# Patient Record
Sex: Female | Born: 1985 | Race: White | Hispanic: No | Marital: Married | Smoking: Never smoker
Health system: Southern US, Community
[De-identification: ages and names within clinical notes are randomized; demographics above are authoritative.]

## PROBLEM LIST (undated history)

## (undated) ENCOUNTER — Inpatient Hospital Stay (HOSPITAL_COMMUNITY): Payer: Self-pay

## (undated) DIAGNOSIS — R112 Nausea with vomiting, unspecified: Secondary | ICD-10-CM

## (undated) DIAGNOSIS — R7303 Prediabetes: Secondary | ICD-10-CM

## (undated) DIAGNOSIS — K219 Gastro-esophageal reflux disease without esophagitis: Secondary | ICD-10-CM

## (undated) DIAGNOSIS — J45909 Unspecified asthma, uncomplicated: Secondary | ICD-10-CM

## (undated) DIAGNOSIS — Z9889 Other specified postprocedural states: Secondary | ICD-10-CM

## (undated) DIAGNOSIS — K589 Irritable bowel syndrome without diarrhea: Secondary | ICD-10-CM

## (undated) DIAGNOSIS — F329 Major depressive disorder, single episode, unspecified: Secondary | ICD-10-CM

## (undated) DIAGNOSIS — O24419 Gestational diabetes mellitus in pregnancy, unspecified control: Secondary | ICD-10-CM

## (undated) DIAGNOSIS — T8859XA Other complications of anesthesia, initial encounter: Secondary | ICD-10-CM

## (undated) DIAGNOSIS — D649 Anemia, unspecified: Secondary | ICD-10-CM

## (undated) DIAGNOSIS — F419 Anxiety disorder, unspecified: Secondary | ICD-10-CM

## (undated) DIAGNOSIS — J189 Pneumonia, unspecified organism: Secondary | ICD-10-CM

## (undated) DIAGNOSIS — B2790 Infectious mononucleosis, unspecified without complication: Secondary | ICD-10-CM

## (undated) DIAGNOSIS — T4145XA Adverse effect of unspecified anesthetic, initial encounter: Secondary | ICD-10-CM

## (undated) DIAGNOSIS — N200 Calculus of kidney: Secondary | ICD-10-CM

## (undated) DIAGNOSIS — F32A Depression, unspecified: Secondary | ICD-10-CM

## (undated) HISTORY — PX: COLONOSCOPY: SHX174

## (undated) HISTORY — PX: KNEE SURGERY: SHX244

## (undated) HISTORY — PX: TONSILLECTOMY: SUR1361

## (undated) HISTORY — PX: UPPER GASTROINTESTINAL ENDOSCOPY: SHX188

## (undated) HISTORY — PX: OTHER SURGICAL HISTORY: SHX169

## (undated) HISTORY — PX: BACK SURGERY: SHX140

---
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
## 2005-06-10 ENCOUNTER — Other Ambulatory Visit: Admission: RE | Admit: 2005-06-10 | Discharge: 2005-06-10 | Payer: Self-pay | Admitting: Obstetrics and Gynecology

## 2012-06-23 DIAGNOSIS — N970 Female infertility associated with anovulation: Secondary | ICD-10-CM | POA: Insufficient documentation

## 2012-08-21 DIAGNOSIS — R102 Pelvic and perineal pain unspecified side: Secondary | ICD-10-CM | POA: Insufficient documentation

## 2014-10-05 ENCOUNTER — Emergency Department
Admission: EM | Admit: 2014-10-05 | Discharge: 2014-10-05 | Disposition: A | Discharge status: Home / Self Care | Payer: Self-pay | Source: Home / Self Care | Attending: Emergency Medicine | Admitting: Emergency Medicine

## 2014-10-05 ENCOUNTER — Encounter: Payer: Self-pay | Admitting: *Deleted

## 2014-10-05 DIAGNOSIS — J039 Acute tonsillitis, unspecified: Secondary | ICD-10-CM

## 2014-10-05 LAB — POCT RAPID STREP A (OFFICE): Rapid Strep A Screen: NEGATIVE

## 2014-10-05 MED ORDER — CEFDINIR 300 MG PO CAPS: 300.0000 mg | ORAL_CAPSULE | Freq: Two times a day (BID) | ORAL | Status: DC

## 2014-10-05 NOTE — ED Notes (Signed)
Pt c/o sore throat and HA x 1 wk. Denies fever.

## 2014-10-05 NOTE — ED Provider Notes (Signed)
CSN: [id number]     Arrival date & time 10/05/14  1946 History   First MD Initiated Contact with Patient 10/05/14 1957     Chief Complaint  Patient presents with  . Sore Throat  . Headache   here with husband  HPI SORE THROAT Onset: 7 days    Severity: moderate-severe Tried OTC meds without significant relief.  Symptoms:  + Fever  + Swollen neck glands No Recent Strep Exposure     No Myalgias Mild Headache No Rash  No Discolored Nasal Mucus No Allergy symptoms No sinus pain/pressure No itchy/red eyes No earache Positive hoarse voice  No Drooling No Trismus  No Nausea No Vomiting No Abdominal pain No Diarrhea No Reflux symptoms  No Cough No Breathing Difficulty No Shortness of Breath No pleuritic pain No Wheezing No Hemoptysis   No past medical history on file. No past surgical history on file. No family history on file. History  Substance Use Topics  . Smoking status: Not on file  . Smokeless tobacco: Not on file  . Alcohol Use: Not on file   OB History    No data available     Review of Systems  All other systems reviewed and are negative.   Allergies  Review of patient's allergies indicates no known allergies.  Home Medications   Prior to Admission medications   Not on File   BP 138/90 mmHg  Pulse 105  Temp(Src) 98.2 F (36.8 C) (Oral)  Resp 18  Ht 5\' 4"  (1.626 m)  Wt 231 lb (104.781 kg)  BMI 39.63 kg/m2  SpO2 98% Physical Exam  Constitutional: She is oriented to person, place, and time. She appears well-developed and well-nourished.  Non-toxic appearance. She appears ill. No distress.  HENT:  Head: Normocephalic and atraumatic.  Right Ear: Tympanic membrane, external ear and ear canal normal.  Left Ear: Tympanic membrane, external ear and ear canal normal.  Nose: Nose normal. Right sinus exhibits no maxillary sinus tenderness and no frontal sinus tenderness. Left sinus exhibits no maxillary sinus tenderness and no frontal  sinus tenderness.  Mouth/Throat: Uvula is midline and mucous membranes are normal. No oral lesions. Oropharyngeal exudate and posterior oropharyngeal erythema present. No tonsillar abscesses.  + 3 Tonsillar enlargement bilaterally  Airway intact.  Eyes: Conjunctivae are normal. No scleral icterus.  Neck: Neck supple.  Cardiovascular: Normal rate, regular rhythm and normal heart sounds.   No murmur heard. Pulmonary/Chest: Effort normal and breath sounds normal. No stridor. No respiratory distress. She has no wheezes. She has no rhonchi. She has no rales.  Abdominal: Soft. She exhibits no mass. There is no hepatosplenomegaly. There is no tenderness.  Lymphadenopathy:    She has cervical adenopathy.       Right cervical: Superficial cervical adenopathy present. No deep cervical and no posterior cervical adenopathy present.      Left cervical: Superficial cervical adenopathy present. No deep cervical and no posterior cervical adenopathy present.  Neurological: She is alert and oriented to person, place, and time.  Skin: Skin is warm. No rash noted.  Psychiatric: She has a normal mood and affect.  Nursing note and vitals reviewed.   ED Course  Procedures (including critical care time) Labs Review Labs Reviewed - No data to display  Imaging Review No results found.   MDM   1. Acute tonsillitis    exudative tonsillitis 7 days Rapid strep test negative Treatment options discussed with patient. After risks, benefits, alternatives discussed, she agrees with the following  plans: New Prescriptions   CEFDINIR (OMNICEF) 300 MG CAPSULE    Take 1 capsule (300 mg total) by mouth 2 (two) times daily. X 10 days   Other symptomatic care discussed. Follow-up with your primary care doctor in 5-7 days if not improving, or sooner if symptoms become worse. Precautions discussed. Red flags discussed. Questions invited and answered. Patient voiced understanding and agreement.     Lajean Manesavid  Massey, MD 10/05/14 2005

## 2015-10-03 ENCOUNTER — Encounter (HOSPITAL_COMMUNITY): Payer: Self-pay

## 2015-10-06 ENCOUNTER — Other Ambulatory Visit: Payer: Self-pay | Admitting: Obstetrics & Gynecology

## 2015-10-20 NOTE — Anesthesia Preprocedure Evaluation (Addendum)
Anesthesia Evaluation  Patient identified by MRN, date of birth, ID band Patient awake    Reviewed: Allergy & Precautions, NPO status , Patient's Chart, lab work & pertinent test results  History of Anesthesia Complications (+) PONV, Emergence Delirium and history of anesthetic complications  Airway Mallampati: II  TM Distance: >3 FB Neck ROM: Full    Dental  (+) Teeth Intact, Dental Advisory Given   Pulmonary asthma , pneumonia, resolved,  Pneumonia following tonsillectomy 8 weeks ago.  Completed antibiotic course.  Feels her breathing is at baseline.   Pulmonary exam normal breath sounds clear to auscultation       Cardiovascular Exercise Tolerance: Good (-) hypertensionnegative cardio ROS Normal cardiovascular exam Rhythm:Regular Rate:Normal     Neuro/Psych PSYCHIATRIC DISORDERS Anxiety Depression negative neurological ROS     GI/Hepatic Neg liver ROS, GERD  ,  Endo/Other  Obesity   Renal/GU negative Renal ROS  negative genitourinary   Musculoskeletal negative musculoskeletal ROS (+)   Abdominal   Peds negative pediatric ROS (+)  Hematology  (+) Blood dyscrasia, anemia ,   Anesthesia Other Findings Day of surgery medications reviewed with the patient.  On day 7/10 of amoxicillin for cold. Denies fever, chills, purulent drainage, negative strep test.  Reproductive/Obstetrics negative OB ROS                           Anesthesia Physical Anesthesia Plan  ASA: II  Anesthesia Plan: General   Post-op Pain Management:    Induction: Intravenous  Airway Management Planned: Oral ETT  Additional Equipment:   Intra-op Plan:   Post-operative Plan: Extubation in OR  Informed Consent: I have reviewed the patients History and Physical, chart, labs and discussed the procedure including the risks, benefits and alternatives for the proposed anesthesia with the patient or authorized  representative who has indicated his/her understanding and acceptance.   Dental advisory given  Plan Discussed with: CRNA  Anesthesia Plan Comments: (Risks/benefits of general anesthesia discussed with patient including risk of damage to teeth, lips, gum, and tongue, nausea/vomiting, allergic reactions to medications, and the possibility of heart attack, stroke and death.  All patient questions answered.  Patient wishes to proceed.)       Anesthesia Quick Evaluation

## 2015-10-25 MED ORDER — DEXTROSE 5 % IV SOLN
100.0000 mg | Freq: Once | INTRAVENOUS | Status: AC
  Administered 2015-10-26: 100 mg via INTRAVENOUS
  Filled 2015-10-25: qty 100

## 2015-10-26 ENCOUNTER — Encounter (HOSPITAL_COMMUNITY)
Admission: RE | Disposition: A | Discharge status: Home / Self Care | Payer: Self-pay | Source: Ambulatory Visit | Attending: Obstetrics & Gynecology

## 2015-10-26 ENCOUNTER — Ambulatory Visit (HOSPITAL_COMMUNITY): Payer: BLUE CROSS/BLUE SHIELD | Admitting: Anesthesiology

## 2015-10-26 ENCOUNTER — Encounter (HOSPITAL_COMMUNITY): Payer: Self-pay | Admitting: *Deleted

## 2015-10-26 ENCOUNTER — Ambulatory Visit (HOSPITAL_COMMUNITY)
Admission: RE | Admit: 2015-10-26 | Discharge: 2015-10-26 | Disposition: A | Discharge status: Home / Self Care | Payer: BLUE CROSS/BLUE SHIELD | Source: Ambulatory Visit | Attending: Obstetrics & Gynecology | Admitting: Obstetrics & Gynecology

## 2015-10-26 DIAGNOSIS — K219 Gastro-esophageal reflux disease without esophagitis: Secondary | ICD-10-CM | POA: Insufficient documentation

## 2015-10-26 DIAGNOSIS — R1032 Left lower quadrant pain: Secondary | ICD-10-CM | POA: Insufficient documentation

## 2015-10-26 DIAGNOSIS — R102 Pelvic and perineal pain: Secondary | ICD-10-CM | POA: Diagnosis present

## 2015-10-26 DIAGNOSIS — J45909 Unspecified asthma, uncomplicated: Secondary | ICD-10-CM | POA: Insufficient documentation

## 2015-10-26 DIAGNOSIS — N946 Dysmenorrhea, unspecified: Secondary | ICD-10-CM | POA: Diagnosis not present

## 2015-10-26 HISTORY — DX: Depression, unspecified: F32.A

## 2015-10-26 HISTORY — DX: Anemia, unspecified: D64.9

## 2015-10-26 HISTORY — DX: Unspecified asthma, uncomplicated: J45.909

## 2015-10-26 HISTORY — PX: LAPAROSCOPY: SHX197

## 2015-10-26 HISTORY — DX: Nausea with vomiting, unspecified: R11.2

## 2015-10-26 HISTORY — DX: Pneumonia, unspecified organism: J18.9

## 2015-10-26 HISTORY — DX: Calculus of kidney: N20.0

## 2015-10-26 HISTORY — DX: Adverse effect of unspecified anesthetic, initial encounter: T41.45XA

## 2015-10-26 HISTORY — DX: Other complications of anesthesia, initial encounter: T88.59XA

## 2015-10-26 HISTORY — DX: Gastro-esophageal reflux disease without esophagitis: K21.9

## 2015-10-26 HISTORY — DX: Other specified postprocedural states: Z98.890

## 2015-10-26 HISTORY — DX: Major depressive disorder, single episode, unspecified: F32.9

## 2015-10-26 HISTORY — PX: CHROMOPERTUBATION: SHX6288

## 2015-10-26 HISTORY — DX: Irritable bowel syndrome, unspecified: K58.9

## 2015-10-26 HISTORY — DX: Anxiety disorder, unspecified: F41.9

## 2015-10-26 LAB — CBC
HEMATOCRIT: 38 % (ref 36.0–46.0)
HEMOGLOBIN: 12.5 g/dL (ref 12.0–15.0)
MCH: 28.2 pg (ref 26.0–34.0)
MCHC: 32.9 g/dL (ref 30.0–36.0)
MCV: 85.8 fL (ref 78.0–100.0)
Platelets: 363 10*3/uL (ref 150–400)
RBC: 4.43 MIL/uL (ref 3.87–5.11)
RDW: 14 % (ref 11.5–15.5)
WBC: 8.3 10*3/uL (ref 4.0–10.5)

## 2015-10-26 LAB — PREGNANCY, URINE: PREG TEST UR: NEGATIVE

## 2015-10-26 SURGERY — LAPAROSCOPY, DIAGNOSTIC
Anesthesia: General | Site: Vagina

## 2015-10-26 MED ORDER — OXYCODONE-ACETAMINOPHEN 5-325 MG PO TABS: 1.0000 | ORAL_TABLET | Freq: Four times a day (QID) | ORAL | Status: DC | PRN

## 2015-10-26 MED ORDER — BUPIVACAINE HCL (PF) 0.25 % IJ SOLN
INTRAMUSCULAR | Status: DC | PRN
  Administered 2015-10-26: 10 mL

## 2015-10-26 MED ORDER — PROPOFOL 10 MG/ML IV BOLUS
INTRAVENOUS | Status: DC | PRN
  Administered 2015-10-26: 200 mg via INTRAVENOUS

## 2015-10-26 MED ORDER — ONDANSETRON 4 MG PO TBDP
ORAL_TABLET | ORAL | Status: AC
  Filled 2015-10-26: qty 1

## 2015-10-26 MED ORDER — PROPOFOL 10 MG/ML IV BOLUS
INTRAVENOUS | Status: AC
  Filled 2015-10-26: qty 20

## 2015-10-26 MED ORDER — LACTATED RINGERS IV SOLN
INTRAVENOUS | Status: DC
  Administered 2015-10-26 (×2): via INTRAVENOUS

## 2015-10-26 MED ORDER — MIDAZOLAM HCL 5 MG/5ML IJ SOLN
INTRAMUSCULAR | Status: DC | PRN
  Administered 2015-10-26: 2 mg via INTRAVENOUS

## 2015-10-26 MED ORDER — ONDANSETRON HCL 4 MG/2ML IJ SOLN
INTRAMUSCULAR | Status: AC
  Filled 2015-10-26: qty 2

## 2015-10-26 MED ORDER — PROMETHAZINE HCL 25 MG/ML IJ SOLN
6.2500 mg | INTRAMUSCULAR | Status: DC | PRN
Start: 2015-10-26 — End: 2015-10-26

## 2015-10-26 MED ORDER — NEOSTIGMINE METHYLSULFATE 10 MG/10ML IV SOLN
INTRAVENOUS | Status: DC | PRN
  Administered 2015-10-26: 5 mg via INTRAVENOUS

## 2015-10-26 MED ORDER — HYDROMORPHONE HCL 1 MG/ML IJ SOLN
INTRAMUSCULAR | Status: DC | PRN
  Administered 2015-10-26: 1 mg via INTRAVENOUS

## 2015-10-26 MED ORDER — OXYCODONE-ACETAMINOPHEN 5-325 MG PO TABS
ORAL_TABLET | ORAL | Status: AC
  Filled 2015-10-26: qty 1

## 2015-10-26 MED ORDER — SCOPOLAMINE 1 MG/3DAYS TD PT72
1.0000 | MEDICATED_PATCH | Freq: Once | TRANSDERMAL | Status: DC
  Administered 2015-10-26: 1.5 mg via TRANSDERMAL

## 2015-10-26 MED ORDER — HYDROMORPHONE HCL 1 MG/ML IJ SOLN
INTRAMUSCULAR | Status: AC
  Filled 2015-10-26: qty 1

## 2015-10-26 MED ORDER — ROCURONIUM BROMIDE 100 MG/10ML IV SOLN
INTRAVENOUS | Status: DC | PRN
  Administered 2015-10-26: 10 mg via INTRAVENOUS
  Administered 2015-10-26: 50 mg via INTRAVENOUS

## 2015-10-26 MED ORDER — KETOROLAC TROMETHAMINE 30 MG/ML IJ SOLN
INTRAMUSCULAR | Status: DC | PRN
  Administered 2015-10-26: 30 mg via INTRAVENOUS

## 2015-10-26 MED ORDER — METHYLENE BLUE 1 % INJ SOLN
INTRAMUSCULAR | Status: AC
  Filled 2015-10-26: qty 1

## 2015-10-26 MED ORDER — ONDANSETRON 4 MG PO TBDP
4.0000 mg | ORAL_TABLET | Freq: Once | ORAL | Status: AC
  Administered 2015-10-26: 4 mg via ORAL

## 2015-10-26 MED ORDER — DEXAMETHASONE SODIUM PHOSPHATE 10 MG/ML IJ SOLN
INTRAMUSCULAR | Status: DC | PRN
  Administered 2015-10-26: 10 mg via INTRAVENOUS

## 2015-10-26 MED ORDER — FENTANYL CITRATE (PF) 100 MCG/2ML IJ SOLN: 25.0000 ug | INTRAMUSCULAR | Status: DC | PRN

## 2015-10-26 MED ORDER — NEOSTIGMINE METHYLSULFATE 10 MG/10ML IV SOLN
INTRAVENOUS | Status: AC
  Filled 2015-10-26: qty 1

## 2015-10-26 MED ORDER — ROCURONIUM BROMIDE 100 MG/10ML IV SOLN
INTRAVENOUS | Status: AC
  Filled 2015-10-26: qty 1

## 2015-10-26 MED ORDER — FENTANYL CITRATE (PF) 100 MCG/2ML IJ SOLN
INTRAMUSCULAR | Status: DC | PRN
Start: 2015-10-26 — End: 2015-10-26
  Administered 2015-10-26: 150 ug via INTRAVENOUS
  Administered 2015-10-26: 100 ug via INTRAVENOUS

## 2015-10-26 MED ORDER — GLYCOPYRROLATE 0.2 MG/ML IJ SOLN
INTRAMUSCULAR | Status: AC
  Filled 2015-10-26: qty 3

## 2015-10-26 MED ORDER — MIDAZOLAM HCL 2 MG/2ML IJ SOLN
INTRAMUSCULAR | Status: AC
  Filled 2015-10-26: qty 2

## 2015-10-26 MED ORDER — FENTANYL CITRATE (PF) 250 MCG/5ML IJ SOLN
INTRAMUSCULAR | Status: AC
  Filled 2015-10-26: qty 5

## 2015-10-26 MED ORDER — LIDOCAINE HCL (CARDIAC) 20 MG/ML IV SOLN
INTRAVENOUS | Status: DC | PRN
  Administered 2015-10-26: 100 mg via INTRAVENOUS

## 2015-10-26 MED ORDER — OXYCODONE-ACETAMINOPHEN 5-325 MG PO TABS
1.0000 | ORAL_TABLET | Freq: Once | ORAL | Status: AC
  Administered 2015-10-26: 1 via ORAL

## 2015-10-26 MED ORDER — LIDOCAINE HCL (CARDIAC) 20 MG/ML IV SOLN
INTRAVENOUS | Status: AC
  Filled 2015-10-26: qty 5

## 2015-10-26 MED ORDER — KETOROLAC TROMETHAMINE 30 MG/ML IJ SOLN
INTRAMUSCULAR | Status: AC
  Filled 2015-10-26: qty 1

## 2015-10-26 MED ORDER — GLYCOPYRROLATE 0.2 MG/ML IJ SOLN
INTRAMUSCULAR | Status: DC | PRN
  Administered 2015-10-26: .6 mg via INTRAVENOUS

## 2015-10-26 MED ORDER — BUPIVACAINE HCL (PF) 0.25 % IJ SOLN
INTRAMUSCULAR | Status: AC
  Filled 2015-10-26: qty 30

## 2015-10-26 MED ORDER — ONDANSETRON HCL 4 MG/2ML IJ SOLN
INTRAMUSCULAR | Status: DC | PRN
  Administered 2015-10-26: 4 mg via INTRAVENOUS

## 2015-10-26 MED ORDER — SCOPOLAMINE 1 MG/3DAYS TD PT72
MEDICATED_PATCH | TRANSDERMAL | Status: AC
  Administered 2015-10-26: 1.5 mg via TRANSDERMAL
  Filled 2015-10-26: qty 1

## 2015-10-26 MED ORDER — DEXAMETHASONE SODIUM PHOSPHATE 10 MG/ML IJ SOLN
INTRAMUSCULAR | Status: AC
  Filled 2015-10-26: qty 1

## 2015-10-26 MED ORDER — SODIUM CHLORIDE 0.9 % IJ SOLN
INTRAMUSCULAR | Status: AC
  Filled 2015-10-26: qty 10

## 2015-10-26 SURGICAL SUPPLY — 35 items
BAG SPEC RTRVL LRG 6X4 10 (ENDOMECHANICALS)
CABLE HIGH FREQUENCY MONO STRZ (ELECTRODE) IMPLANT
CATH ROBINSON RED A/P 16FR (CATHETERS) ×2 IMPLANT
CHLORAPREP W/TINT 26ML (MISCELLANEOUS) ×4 IMPLANT
CLOTH BEACON ORANGE TIMEOUT ST (SAFETY) ×4 IMPLANT
DRSG COVADERM PLUS 2X2 (GAUZE/BANDAGES/DRESSINGS) ×4 IMPLANT
DRSG OPSITE POSTOP 3X4 (GAUZE/BANDAGES/DRESSINGS) ×4 IMPLANT
EVACUATOR SMOKE 8.L (FILTER) ×2 IMPLANT
FORCEPS CUTTING 33CM 5MM (CUTTING FORCEPS) IMPLANT
FORCEPS CUTTING 45CM 5MM (CUTTING FORCEPS) IMPLANT
GLOVE BIO SURGEON STRL SZ7 (GLOVE) ×4 IMPLANT
GLOVE BIOGEL PI IND STRL 7.0 (GLOVE) ×4 IMPLANT
GLOVE BIOGEL PI INDICATOR 7.0 (GLOVE) ×4
GOWN STRL REUS W/TWL LRG LVL3 (GOWN DISPOSABLE) ×14 IMPLANT
IV SET EXTENSION 150ML W/STPCK (IV SETS) ×2 IMPLANT
LIQUID BAND (GAUZE/BANDAGES/DRESSINGS) ×4 IMPLANT
MANIPULATOR UTERINE 4.5 ZUMI (MISCELLANEOUS) ×4 IMPLANT
NEEDLE INSUFFLATION 120MM (ENDOMECHANICALS) ×4 IMPLANT
NS IRRIG 1000ML POUR BTL (IV SOLUTION) ×4 IMPLANT
PACK LAPAROSCOPY BASIN (CUSTOM PROCEDURE TRAY) ×4 IMPLANT
PAD POSITIONING PINK XL (MISCELLANEOUS) ×4 IMPLANT
POUCH SPECIMEN RETRIEVAL 10MM (ENDOMECHANICALS) IMPLANT
SCISSORS LAP 5X35 DISP (ENDOMECHANICALS) IMPLANT
SET IRRIG TUBING LAPAROSCOPIC (IRRIGATION / IRRIGATOR) IMPLANT
SLEEVE XCEL OPT CAN 5 100 (ENDOMECHANICALS) ×1 IMPLANT
SOLUTION ELECTROLUBE (MISCELLANEOUS) IMPLANT
SUT VICRYL 0 UR6 27IN ABS (SUTURE) ×4 IMPLANT
SUT VICRYL 4-0 PS2 18IN ABS (SUTURE) ×4 IMPLANT
SYR TOOMEY 50ML (SYRINGE) ×2 IMPLANT
TOWEL OR 17X24 6PK STRL BLUE (TOWEL DISPOSABLE) ×8 IMPLANT
TROCAR BALLN 12MMX100 BLUNT (TROCAR) ×4 IMPLANT
TROCAR XCEL NON-BLD 11X100MML (ENDOMECHANICALS) ×4 IMPLANT
TROCAR XCEL NON-BLD 5MMX100MML (ENDOMECHANICALS) ×4 IMPLANT
WARMER LAPAROSCOPE (MISCELLANEOUS) ×4 IMPLANT
WATER STERILE IRR 1000ML POUR (IV SOLUTION) ×4 IMPLANT

## 2015-10-26 NOTE — Transfer of Care (Signed)
Immediate Anesthesia Transfer of Care Note  Patient: Amy Rodgers  Procedure(s) Performed: Procedure(s): LAPAROSCOPY DIAGNOSTIC  (N/A) CHROMOPERTUBATION (Bilateral)  Patient Location: PACU  Anesthesia Type:General  Level of Consciousness: awake, alert  and oriented  Airway & Oxygen Therapy: Patient Spontanous Breathing and Patient connected to face mask oxygen  Post-op Assessment: Report given to RN and Post -op Vital signs reviewed and stable  Post vital signs: Reviewed and stable  Last Vitals:  Filed Vitals:   10/26/15 1140  BP: 146/97  Pulse: 92  Temp: 36.8 C  Resp: 18    Complications: No apparent anesthesia complications

## 2015-10-26 NOTE — Op Note (Addendum)
Preoperative diagnosis: Pelvic pain, Dysmenorrhea Postoperative diagnosis: Same Procedure: Diagnostic laparoscopy, chromopertubation Surgeon: Dr Shea EvansVaishali Annelisa Ryback, MD Assistants: Donette LarryMelanie Bhambri, CNM Anesthesia Gen. Endotracheal IV fluids LR 2500 cc  EBL minimal, 10 cc Drains: Intra-op Foley Complications none Disposition PACU and home Specimens none  Procedure Patient is 29 yo, G0, who was seen in office for recurrent pelvic pain, especially left lower quadrant since August'16 that would get for few weeks and get worse with menses. She was put on continuous oral contraceptive pills and pain improved but would return with break though bleeding or with menses. So endometriosis was suspected. She also had prior failed infertility treatments with a specialist. So laparoscopy was planned with possible fulguration of endometriosis if noted.  Risk and complications of surgery including infection, bleeding, damage to internal organs, other complications including pneumonia, VTE were reviewed. Patient voiced understanding. Informed written consent was obtained and patient was brought to the operating room with IV running. Timeout was carried out. She underwent general anesthesia without difficulty and was given dorsal lithotomy position. Examination under anesthesia revealed retroverted normal size uterus. Parts prepped and draped in standard fashion. Foley placed in bladder. Speculum was placed, anterior lip of cervix was grasped with tenaculum, uterus was sounded to 7 cm and was retroverted. Cervical os was dilated and a Rubin canula with acorn tip was introduced and secured to tenaculum.  Gloves/gown changed attention was focused on the abdomen. A 10 mm vertical incision was made at the upper edge of umbilicus after injecting 0.25% Marcaine. Incision was carried down to the fascia was incised peritoneal entry was confirmed. Hassan cannula was introduced and its balloon inflated to seal the incision.  Pneumoinsufflation was begun with CO2. Patient was given Trendelenburg position. A 0 laparoscope was introduced. No entry related complications noted. Marcaine injected in right lower quadrant and a 5 mm trocar introduced via small incision under vision. With a graspers, uterus, tubes, ovaries, pelvic peritoneum, cul-de-sac were evaluated and appeared normal. Appendix, bowels, omentum and liver appeared normal. There was no evidence of endometriosis or pelvic adhesions or any abnormal findings.  Methylene blue dye infused per uterus and bilateral free spill of dye noted from both the fallopian tubes.  RLQ trocar removed, pneumoperitoneum released and central trocar removed. Fascial incision was closed with 0 Vicryl. Skin closed with 4-0 Vicryl in subcuticular fashion. Dermabond was applied at the incisions. Uterine manipulator was removed, foley catheter was removed.  Hemostasis was excellent. All counts were correct x2. Patient brought to the recovery room after extubation.  Plan is to discharge home from recovery room. Surgical findings were discussed with patient's husband.  Followup with Dr. Juliene PinaMody in office in 2 weeks.  I performed the procedure. V.Yolonda Purtle, MD

## 2015-10-26 NOTE — Anesthesia Procedure Notes (Signed)
Procedure Name: Intubation Date/Time: 10/26/2015 1:26 PM Performed by: Junious SilkGILBERT, Onesimo Lingard Pre-anesthesia Checklist: Patient identified, Emergency Drugs available, Suction available, Patient being monitored and Timeout performed Patient Re-evaluated:Patient Re-evaluated prior to inductionOxygen Delivery Method: Circle system utilized Preoxygenation: Pre-oxygenation with 100% oxygen Intubation Type: IV induction Ventilation: Mask ventilation without difficulty Laryngoscope Size: Miller and 2 Grade View: Grade I Tube type: Oral Tube size: 7.0 mm Number of attempts: 1 Airway Equipment and Method: Stylet Placement Confirmation: ETT inserted through vocal cords under direct vision,  positive ETCO2,  CO2 detector and breath sounds checked- equal and bilateral Secured at: 20 cm Tube secured with: Tape Dental Injury: Teeth and Oropharynx as per pre-operative assessment

## 2015-10-26 NOTE — Discharge Instructions (Signed)

## 2015-10-26 NOTE — H&P (Signed)
Jolene Schimkeshley M Mullett is an 29 y.o. female here for laparoscopy for persistent left lower quadrant/ pelvic pain since Aug'16, worse with menses, suspect endometriosis. H/o ovarian cysts in past from PCOS and anovulation, generally better with OCs. [facility].  Patient with PCOS, prior ovulation/ Ovidrel / IUI failure x2.   Normal Paps, irreg menses, currently on OCs.   No LMP recorded. Patient is not currently having periods (Reason: Oral contraceptives).    Past Medical History  Diagnosis Date  . Asthma   . Pneumonia     10/16  . Anxiety   . Depression   . GERD (gastroesophageal reflux disease)   . IBS (irritable bowel syndrome)   . Anemia     Borderline  . Complication of anesthesia     "freaks out waking up from surgery"  . Kidney stones   . PONV (postoperative nausea and vomiting)     Past Surgical History  Procedure Laterality Date  . Back surgery    . Knee surgery Left   . Tonsillectomy    . Kidney stent    . Colonoscopy    . Upper gastrointestinal endoscopy      Family History  Problem Relation Age of Onset  . Hypertension Mother   . Hypertension Father   . Heart disease Father     Social History:  reports that she has never smoked. She has never used smokeless tobacco. She reports that she does not drink alcohol or use illicit drugs.  Allergies: No Known Allergies  Prescriptions prior to admission  Medication Sig Dispense Refill Last Dose  . ibuprofen (ADVIL,MOTRIN) 600 MG tablet Take 600 mg by mouth every 6 (six) hours as needed for mild pain.   Past Month at Unknown time  . LORazepam (ATIVAN) 0.5 MG tablet Take 0.5 mg by mouth 2 (two) times daily as needed for anxiety.   10/26/2015 at 2AM  . norethindrone-ethinyl estradiol-iron (MICROGESTIN FE,GILDESS FE,LOESTRIN FE) 1.5-30 MG-MCG tablet Take 1 tablet by mouth daily.   10/25/2015 at Unknown time  . cefdinir (OMNICEF) 300 MG capsule Take 1 capsule (300 mg total) by mouth 2 (two)  times daily. X 10 days (Patient not taking: Reported on 10/16/2015) 20 capsule 0     ROS  neg   Physical Exam  Blood pressure 146/97, pulse 92, temperature 98.2 F (36.8 C), temperature source Oral, resp. rate 18, height 5\' 5"  (1.651 m), weight 212 lb (96.163 kg), SpO2 99 %.  A&O x 3, no acute distress. Pleasant HEENT neg, no thyromegaly Lungs CTA bilat CV RRR, S1S2 normal Abdo soft, non tender, non acute Extr no edema/ tenderness Pelvic  LLQ tenderness, no pelvic mass/ no CMT/ normal cervix.   Results for orders placed or performed during the hospital encounter of 10/26/15 (from the past 24 hour(s))  CBC     Status: None   Collection Time: 10/26/15 11:37 AM  Result Value Ref Range   WBC 8.3 4.0 - 10.5 K/uL   RBC 4.43 3.87 - 5.11 MIL/uL   Hemoglobin 12.5 12.0 - 15.0 g/dL   HCT 72.538.0 36.636.0 - 44.046.0 %   MCV 85.8 78.0 - 100.0 fL   MCH 28.2 26.0 - 34.0 pg   MCHC 32.9 30.0 - 36.0 g/dL   RDW 34.714.0 42.511.5 - 95.615.5 %   Platelets 363 150 - 400 K/uL    No results found.  Assessment/Plan: 29 yo with LLQ pain. Here for operative laparoscopy and possible fulguration of endometriosis  if noted.  Risks/complications of surgery reviewed incl infection, bleeding, damage to internal organs including bladder, bowels, ureters, blood vessels, other risks from anesthesia, VTE and delayed complications of any surgery, complications in future surgery reviewed.    Jakia Kennebrew R 10/26/2015, 12:01 PM

## 2015-10-26 NOTE — Anesthesia Postprocedure Evaluation (Signed)
Anesthesia Post Note  Patient: Amy Rodgers  Procedure(s) Performed: Procedure(s) (LRB): LAPAROSCOPY DIAGNOSTIC  (N/A) CHROMOPERTUBATION (Bilateral)  Patient location during evaluation: PACU Anesthesia Type: General Level of consciousness: awake and alert Pain management: pain level controlled Vital Signs Assessment: post-procedure vital signs reviewed and stable Respiratory status: spontaneous breathing, nonlabored ventilation, respiratory function stable and patient connected to nasal cannula oxygen Cardiovascular status: blood pressure returned to baseline and stable Postop Assessment: no signs of nausea or vomiting Anesthetic complications: no    Last Vitals:  Filed Vitals:   10/26/15 1445 10/26/15 1500  BP: 148/98   Pulse: 78 82  Temp:    Resp: 16 16    Last Pain:  Filed Vitals:   10/26/15 1502  PainSc: 0-No pain                 Reino KentJudd, Berenice Oehlert J

## 2015-10-27 ENCOUNTER — Encounter (HOSPITAL_COMMUNITY): Payer: Self-pay | Admitting: Obstetrics & Gynecology

## 2016-06-05 LAB — OB RESULTS CONSOLE HEPATITIS B SURFACE ANTIGEN: HEP B S AG: NEGATIVE

## 2016-06-05 LAB — OB RESULTS CONSOLE RUBELLA ANTIBODY, IGM: Rubella: IMMUNE

## 2016-06-05 LAB — OB RESULTS CONSOLE RPR: RPR: NONREACTIVE

## 2016-06-05 LAB — OB RESULTS CONSOLE ABO/RH: RH TYPE: POSITIVE

## 2016-06-05 LAB — OB RESULTS CONSOLE HIV ANTIBODY (ROUTINE TESTING): HIV: NONREACTIVE

## 2016-06-05 LAB — OB RESULTS CONSOLE GC/CHLAMYDIA
Chlamydia: NEGATIVE
Gonorrhea: NEGATIVE

## 2016-06-05 LAB — OB RESULTS CONSOLE ANTIBODY SCREEN: Antibody Screen: NEGATIVE

## 2016-06-11 ENCOUNTER — Inpatient Hospital Stay (HOSPITAL_COMMUNITY)
Admission: AD | Admit: 2016-06-11 | Discharge: 2016-06-11 | Disposition: A | Discharge status: Home / Self Care | Payer: BLUE CROSS/BLUE SHIELD | Source: Ambulatory Visit | Attending: Obstetrics & Gynecology | Admitting: Obstetrics & Gynecology

## 2016-06-11 ENCOUNTER — Encounter (HOSPITAL_COMMUNITY): Payer: Self-pay | Admitting: *Deleted

## 2016-06-11 DIAGNOSIS — O99611 Diseases of the digestive system complicating pregnancy, first trimester: Secondary | ICD-10-CM | POA: Diagnosis not present

## 2016-06-11 DIAGNOSIS — R42 Dizziness and giddiness: Secondary | ICD-10-CM | POA: Diagnosis present

## 2016-06-11 DIAGNOSIS — R112 Nausea with vomiting, unspecified: Secondary | ICD-10-CM | POA: Diagnosis present

## 2016-06-11 DIAGNOSIS — O219 Vomiting of pregnancy, unspecified: Secondary | ICD-10-CM

## 2016-06-11 DIAGNOSIS — Z7984 Long term (current) use of oral hypoglycemic drugs: Secondary | ICD-10-CM | POA: Insufficient documentation

## 2016-06-11 DIAGNOSIS — O218 Other vomiting complicating pregnancy: Secondary | ICD-10-CM | POA: Insufficient documentation

## 2016-06-11 DIAGNOSIS — Z3A11 11 weeks gestation of pregnancy: Secondary | ICD-10-CM | POA: Insufficient documentation

## 2016-06-11 DIAGNOSIS — F419 Anxiety disorder, unspecified: Secondary | ICD-10-CM | POA: Diagnosis not present

## 2016-06-11 DIAGNOSIS — O26891 Other specified pregnancy related conditions, first trimester: Secondary | ICD-10-CM | POA: Insufficient documentation

## 2016-06-11 DIAGNOSIS — J45909 Unspecified asthma, uncomplicated: Secondary | ICD-10-CM | POA: Diagnosis not present

## 2016-06-11 DIAGNOSIS — K589 Irritable bowel syndrome without diarrhea: Secondary | ICD-10-CM | POA: Insufficient documentation

## 2016-06-11 DIAGNOSIS — K219 Gastro-esophageal reflux disease without esophagitis: Secondary | ICD-10-CM | POA: Diagnosis not present

## 2016-06-11 DIAGNOSIS — O99511 Diseases of the respiratory system complicating pregnancy, first trimester: Secondary | ICD-10-CM | POA: Insufficient documentation

## 2016-06-11 DIAGNOSIS — R109 Unspecified abdominal pain: Secondary | ICD-10-CM | POA: Diagnosis present

## 2016-06-11 DIAGNOSIS — O99341 Other mental disorders complicating pregnancy, first trimester: Secondary | ICD-10-CM | POA: Diagnosis not present

## 2016-06-11 DIAGNOSIS — F329 Major depressive disorder, single episode, unspecified: Secondary | ICD-10-CM | POA: Diagnosis not present

## 2016-06-11 DIAGNOSIS — R55 Syncope and collapse: Secondary | ICD-10-CM | POA: Diagnosis not present

## 2016-06-11 DIAGNOSIS — R7303 Prediabetes: Secondary | ICD-10-CM | POA: Insufficient documentation

## 2016-06-11 HISTORY — DX: Infectious mononucleosis, unspecified without complication: B27.90

## 2016-06-11 HISTORY — DX: Prediabetes: R73.03

## 2016-06-11 LAB — URINE MICROSCOPIC-ADD ON: RBC / HPF: NONE SEEN RBC/hpf (ref 0–5)

## 2016-06-11 LAB — URINALYSIS, ROUTINE W REFLEX MICROSCOPIC
BILIRUBIN URINE: NEGATIVE
Glucose, UA: NEGATIVE mg/dL
Hgb urine dipstick: NEGATIVE
KETONES UR: NEGATIVE mg/dL
NITRITE: NEGATIVE
PH: 5.5 (ref 5.0–8.0)
Protein, ur: NEGATIVE mg/dL
Specific Gravity, Urine: 1.01 (ref 1.005–1.030)

## 2016-06-11 MED ORDER — PROMETHAZINE HCL 25 MG PO TABS: 25.0000 mg | ORAL_TABLET | Freq: Four times a day (QID) | ORAL | 1 refills | Status: DC | PRN

## 2016-06-11 MED ORDER — PROMETHAZINE HCL 25 MG PO TABS
25.0000 mg | ORAL_TABLET | Freq: Once | ORAL | Status: AC
  Administered 2016-06-11: 25 mg via ORAL
  Filled 2016-06-11: qty 1

## 2016-06-11 NOTE — MAU Note (Signed)
Pt presents to MAU with complaints of lower abdominal cramping with nausea and vomiting. Denies any vaginal bleeding or abnormal discharge. History of constipation with pregnancy

## 2016-06-11 NOTE — MAU Provider Note (Signed)
Chief Complaint: Emesis   First Provider Initiated Contact with Patient 06/11/16 1712     SUBJECTIVE HPI: Amy Rodgers is a 30 y.o. G1P0 at [redacted]w[redacted]d who was sent to Maternity Admissions after calling [facility] regarding an episode nausea and vomiting, abdominal pain and dizziness. States she is supposed to get IV fluids. Patient has had mild nausea and vomiting of pregnancy for the past several weeks but reports more persistent nausea and a few episodes of vomiting since yesterday. This afternoon she experienced an episode of severe generalized abdominal cramping followed by feeling diaphoretic and dizzy, almost pass out or he states she was able to sit down on the bathroom floor without falling. She then vomited a large amount of abdominal pain and other symptoms resolved soon after. Patient is also instructed with constipation. Often goes four days without a bowel movement. Discussed these problems with her providers at [facility]. Was prescribed MiraLAX, some sort nausea medicine, Protonix. Took first dose of MiraLAX this afternoon but hasn't started other medications.  Had ultrasound the office in 6 weeks where they were able to see the heartbeat pr pt. Has been seen in the office since then and they were able to Doppler fetal heart tones. Had pelvic exam in office last week.   Location: low abd from umbilicus down. Quality: sharp cramping Severity: 10/10 on pain scale when it occurred, minimal pain now.  Duration: x few minutes Course: resolved Timing: once  Modifying factors: Resolved after vomiting Associated signs and symptoms: Pos for N/V, constipation. Neg for fever, chills, diarrhea, sick contacts, vaginal bleeding, vaginal discharge.   Past Medical History:  Diagnosis Date  . Anemia    Borderline  . Anxiety   . Asthma   . Complication of anesthesia    "freaks out waking up from surgery"  . Depression   . GERD (gastroesophageal reflux disease)   . IBS (irritable  bowel syndrome)   . Kidney stones   . Mononucleosis   . Pneumonia    10/16  . PONV (postoperative nausea and vomiting)   . Pre-diabetes    OB History  Gravida Para Term Preterm AB Living  1            SAB TAB Ectopic Multiple Live Births               # Outcome Date GA Lbr Len/2nd Weight Sex Delivery Anes PTL Lv  1 Current              Past Surgical History:  Procedure Laterality Date  . BACK SURGERY    . CHROMOPERTUBATION Bilateral 10/26/2015   Procedure: CHROMOPERTUBATION;  Surgeon: Shea Evans, MD;  Location: [facility];  Service: Gynecology;  Laterality: Bilateral;  . COLONOSCOPY    . Kidney stent    . KNEE SURGERY Left   . LAPAROSCOPY N/A 10/26/2015   Procedure: LAPAROSCOPY DIAGNOSTIC ;  Surgeon: Shea Evans, MD;  Location: [facility];  Service: Gynecology;  Laterality: N/A;  . TONSILLECTOMY    . UPPER GASTROINTESTINAL ENDOSCOPY     Social History   Social History  . Marital status: Married    Spouse name: N/A  . Number of children: N/A  . Years of education: N/A   Occupational History  . Not on file.   Social History Main Topics  . Smoking status: Never Smoker  . Smokeless tobacco: Never Used  . Alcohol use No  . Drug use: No  . Sexual activity: Yes    Birth  control/ protection: Pill   Other Topics Concern  . Not on file   Social History Narrative  . No narrative on file   No current facility-administered medications on file prior to encounter.    No current outpatient prescriptions on file prior to encounter.   Allergies  Allergen Reactions  . Codeine Other (See Comments)    headaches    I have reviewed the past Medical Hx, Surgical Hx, Social Hx, Allergies and Medications.   Review of Systems  Constitutional: Negative for appetite change, chills and fever.  Gastrointestinal: Positive for abdominal pain, constipation, nausea and vomiting. Negative for abdominal distention, blood in stool and diarrhea.  Genitourinary: Negative for dysuria, flank  pain, hematuria, pelvic pain, vaginal bleeding and vaginal discharge.  Musculoskeletal: Negative for back pain.  Neurological: Positive for dizziness and syncope (Near-syncope).    OBJECTIVE Patient Vitals for the past 24 hrs:  BP Temp Pulse Resp  06/11/16 1537 128/80 98.1 F (36.7 C) 86 18   Constitutional: Well-developed, well-nourished female in no acute distress.  Skin: No pallor or diaphoresis.  Cardiovascular: normal rate Respiratory: normal rate and effort.  GI: Abd soft, non-tender, gravid appropriate for gestational age. Pos BS x 4 Neurologic: Alert and oriented x 4.  GU: Neg CVAT.  SPECULUM EXAM: Declined  Fetal heart rate 161 by Doppler.  LAB RESULTS Results for orders placed or performed during the hospital encounter of 06/11/16 (from the past 24 hour(s))  Urinalysis, Routine w reflex microscopic (not at [facility])     Status: Abnormal   Collection Time: 06/11/16  3:29 PM  Result Value Ref Range   Color, Urine YELLOW YELLOW   APPearance CLEAR CLEAR   Specific Gravity, Urine 1.010 1.005 - 1.030   pH 5.5 5.0 - 8.0   Glucose, UA NEGATIVE NEGATIVE mg/dL   Hgb urine dipstick NEGATIVE NEGATIVE   Bilirubin Urine NEGATIVE NEGATIVE   Ketones, ur NEGATIVE NEGATIVE mg/dL   Protein, ur NEGATIVE NEGATIVE mg/dL   Nitrite NEGATIVE NEGATIVE   Leukocytes, UA TRACE (A) NEGATIVE  Urine microscopic-add on     Status: Abnormal   Collection Time: 06/11/16  3:29 PM  Result Value Ref Range   Squamous Epithelial / LPF 6-30 (A) NONE SEEN   WBC, UA 0-5 0 - 5 WBC/hpf   RBC / HPF NONE SEEN 0 - 5 RBC/hpf   Bacteria, UA MANY (A) NONE SEEN    IMAGING No results found.  MAU COURSE UA, Phenergan PO.   Discussed Hx, exam, US showing no dehydration w/ Dr. Juliene Pina. Does not need IV fluids. Follow plan from office to take antiemetic, Miralax.    MDM - 30 year old female [redacted] weeks gestation with episode of abdominal pain likely caused by intestinal spasm leading to vasovagal near-syncope.  Symptoms resolved spontaneously. Patient is hemodynamically stable.  ASSESSMENT 1. Vasovagal near-syncope   2. Nausea/vomiting in pregnancy     PLAN Discharge home in stable conditionPer consultation with Dr. Juliene Pina. First trimester Precautions Rx Phenergan. Recommend MiraLAX twice a day until having soft, daily bowel movements then daily as needed for constipation prevention. Increase fluids and fiber. Hyperemesis diet discussed. Follow-up Information    Robley Fries, MD .   Specialty:  Obstetrics and Gynecology Why:  As scheduled or sooner as needed if symptoms worsen Contact information: [address] [place] [address] [phone]        THE [facility] [place] MATERNITY ADMISSIONS .   Why:  As needed in emergencies Contact information: [id number] [address] [place] [address] [phone]           Medication List    TAKE these medications   aluminum-magnesium hydroxide-simethicone 200-200-20 MG/5ML Susp Commonly known as:  MAALOX Take 30 mLs by mouth 3 (three) times daily as needed (for upset stomach).   metFORMIN 500 MG tablet Commonly known as:  GLUCOPHAGE Take 500 mg by mouth at bedtime.   polyethylene glycol packet Commonly known as:  MIRALAX / GLYCOLAX Take 17 g by mouth daily.   prenatal multivitamin Tabs tablet Take 1 tablet by mouth at bedtime.   promethazine 25 MG tablet Commonly known as:  PHENERGAN Take 1 tablet (25 mg total) by mouth every 6 (six) hours as needed.        [place], [place] 06/11/2016  6:25 PM

## 2016-06-11 NOTE — Discharge Instructions (Signed)
Eating Plan for Hyperemesis Gravidarum Severe cases of hyperemesis gravidarum can lead to dehydration and malnutrition. The hyperemesis eating plan is one way to lessen the symptoms of nausea and vomiting. It is often used with prescribed medicines to control your symptoms.  WHAT CAN I DO TO RELIEVE MY SYMPTOMS? Listen to your body. Everyone is different and has different preferences. Find what works best for you. Some of the following things may help:  Eat and drink slowly.  Eat 5-6 small meals daily instead of 3 large meals.   Eat crackers before you get out of bed in the morning.   Starchy foods are usually well tolerated (such as cereal, toast, bread, potatoes, pasta, rice, and pretzels).   Ginger may help with nausea. Add  tsp ground ginger to hot tea or choose ginger tea.   Try drinking 100% fruit juice or an electrolyte drink.  Continue to take your prenatal vitamins as directed by your health care provider. If you are having trouble taking your prenatal vitamins, talk with your health care provider about different options.  Include at least 1 serving of protein with your meals and snacks (such as meats or poultry, beans, nuts, eggs, or yogurt). Try eating a protein-rich snack before bed (such as cheese and crackers or a half [place] or peanut butter sandwich). WHAT THINGS SHOULD I AVOID TO REDUCE MY SYMPTOMS? The following things may help reduce your symptoms:  Avoid foods with strong smells. Try eating meals in well-ventilated areas that are free of odors.  Avoid drinking water or other beverages with meals. Try not to drink anything less than 30 minutes before and after meals.  Avoid drinking more than 1 cup of fluid at a time.  Avoid fried or high-fat foods, such as butter and cream sauces.  Avoid spicy foods.  Avoid skipping meals the best you can. Nausea can be more intense on an empty stomach. If you cannot tolerate food at that time, do not force it. Try sucking on  ice chips or other frozen items and make up the calories later.  Avoid lying down within 2 hours after eating.   This information is not intended to replace advice given to you by your health care provider. Make sure you discuss any questions you have with your health care provider.   Document Released: 09/01/2007 Document Revised: 11/09/2013 Document Reviewed: 09/08/2013 Elsevier Interactive Patient Education 2016 [employer].  Constipation, Adult Constipation is when a person has fewer than three bowel movements a week, has difficulty having a bowel movement, or has stools that are dry, hard, or larger than normal. As people grow older, constipation is more common. A low-fiber diet, not taking in enough fluids, and taking certain medicines may make constipation worse.  CAUSES   Certain medicines, such as antidepressants, pain medicine, iron supplements, antacids, and water pills.   Certain diseases, such as diabetes, irritable bowel syndrome (IBS), thyroid disease, or depression.   Not drinking enough water.   Not eating enough fiber-rich foods.   Stress or travel.   Lack of physical activity or exercise.   Ignoring the urge to have a bowel movement.   Using laxatives too much.  SIGNS AND SYMPTOMS   Having fewer than three bowel movements a week.   Straining to have a bowel movement.   Having stools that are hard, dry, or larger than normal.   Feeling full or bloated.   Pain in the lower abdomen.   Not feeling relief after having a  bowel movement.  DIAGNOSIS  Your health care provider will take a medical history and perform a physical exam. Further testing may be done for severe constipation. Some tests may include:  A barium enema X-ray to examine your rectum, colon, and, sometimes, your small intestine.   A sigmoidoscopy to examine your lower colon.   A colonoscopy to examine your entire colon. TREATMENT  Treatment will depend on the severity of  your constipation and what is causing it. Some dietary treatments include drinking more fluids and eating more fiber-rich foods. Lifestyle treatments may include regular exercise. If these diet and lifestyle recommendations do not help, your health care provider may recommend taking over-the-counter laxative medicines to help you have bowel movements. Prescription medicines may be prescribed if over-the-counter medicines do not work.  HOME CARE INSTRUCTIONS   Eat foods that have a lot of fiber, such as fruits, vegetables, whole grains, and beans.  Limit foods high in fat and processed sugars, such as french fries, hamburgers, cookies, candies, and soda.   A fiber supplement may be added to your diet if you cannot get enough fiber from foods.   Drink enough fluids to keep your urine clear or pale yellow.   Exercise regularly or as directed by your health care provider.   Go to the restroom when you have the urge to go. Do not hold it.   Only take over-the-counter or prescription medicines as directed by your health care provider. Do not take other medicines for constipation without talking to your health care provider first.  SEEK IMMEDIATE MEDICAL CARE IF:   You have bright red blood in your stool.   Your constipation lasts for more than 4 days or gets worse.   You have abdominal or rectal pain.   You have thin, pencil-like stools.   You have unexplained weight loss. MAKE SURE YOU:   Understand these instructions.  Will watch your condition.  Will get help right away if you are not doing well or get worse.   This information is not intended to replace advice given to you by your health care provider. Make sure you discuss any questions you have with your health care provider.   Document Released: 08/02/2004 Document Revised: 11/25/2014 Document Reviewed: 08/16/2013 Elsevier Interactive Patient Education [employer]

## 2016-06-11 NOTE — MAU Note (Signed)
Pt has had constipation her entire pregnancy, was prescribed some meds for this by her OB.  Took one dose of miralax today, vomiting started yesterday.  Pt states her MD thinks her vomiting may be related to her constipation.  Has mild intermittent abdominal pain.  Denies bleeding.

## 2016-07-17 ENCOUNTER — Ambulatory Visit: Payer: BLUE CROSS/BLUE SHIELD | Admitting: Physician Assistant

## 2016-07-24 ENCOUNTER — Other Ambulatory Visit (HOSPITAL_COMMUNITY): Payer: Self-pay | Admitting: Obstetrics & Gynecology

## 2016-07-24 DIAGNOSIS — O280 Abnormal hematological finding on antenatal screening of mother: Secondary | ICD-10-CM

## 2016-07-24 DIAGNOSIS — Z3689 Encounter for other specified antenatal screening: Secondary | ICD-10-CM

## 2016-07-30 ENCOUNTER — Ambulatory Visit (HOSPITAL_COMMUNITY)
Admission: RE | Admit: 2016-07-30 | Discharge: 2016-07-30 | Disposition: A | Discharge status: Home / Self Care | Payer: BLUE CROSS/BLUE SHIELD | Source: Ambulatory Visit | Attending: Obstetrics & Gynecology | Admitting: Obstetrics & Gynecology

## 2016-07-30 ENCOUNTER — Encounter (HOSPITAL_COMMUNITY): Payer: Self-pay

## 2016-07-30 DIAGNOSIS — Z315 Encounter for genetic counseling: Secondary | ICD-10-CM | POA: Diagnosis present

## 2016-07-30 DIAGNOSIS — O283 Abnormal ultrasonic finding on antenatal screening of mother: Secondary | ICD-10-CM | POA: Insufficient documentation

## 2016-07-30 DIAGNOSIS — Z3689 Encounter for other specified antenatal screening: Secondary | ICD-10-CM

## 2016-07-30 DIAGNOSIS — O280 Abnormal hematological finding on antenatal screening of mother: Secondary | ICD-10-CM

## 2016-07-30 DIAGNOSIS — Z3A18 18 weeks gestation of pregnancy: Secondary | ICD-10-CM | POA: Insufficient documentation

## 2016-07-30 NOTE — Progress Notes (Signed)
Genetic Counseling  High-Risk Gestation Note  Appointment Date:  07/30/2016 Referred By: Amy Rodgers, Vaishali, MD Date of Birth:  Nov 27, 1985 Partner: Amy Rodgers   Pregnancy History: G1P0 Estimated Date of Delivery: 12/31/16 Estimated Gestational Age: 3643w0d Attending: Particia NearingMartha Decker, MD   Amy Rodgers and her husband, Mr. Amy Rodgers [facility] Instituteiny[facility], were seen for genetic counseling because of an increased risk for fetal Down syndrome based on Quad screen through [facility].  In summary:  Reviewed results of Quad screening test  Increased risk for Down syndrome (1 in 169)  NIPS subsequently performed through OB- [name] within normal limits  Discussed additional screening options  Ultrasound-performed today; within normal limits  Discussed diagnostic testing options  Amniocentesis-declined  Reviewed family history concerns  Discussed general population carrier screening options- declined CF carrier screening  They were counseled regarding the screening result and the associated 1 in 169 risk for fetal Down syndrome.  We reviewed chromosomes, nondisjunction, and the common features and variable prognosis of Down syndrome.  In addition, we reviewed the screen adjusted reduction in risks for trisomy 18 and open neural tube defects.  We also discussed other explanations for a screen positive result including: a gestational dating error, differences in maternal metabolism, and normal variation.  Amy Rodgers subsequently had noninvasive prenatal screening (NIPS)/prenatal cell free DNA testing performed through her OB office, which was within normal range, negative for aneuploidy for chromosomes 21, 18,13, X and Y. Specifically, she had [name] through [facility]. We discussed that NIPS analyzes placental cell free DNA in maternal circulation to evaluate for the presence of extra chromosome conditions.  Thus, it is able to provide risk assessment for specific chromosome conditions, but  is not diagnostic. Based on this test, the chance for her baby to have aneuploidy for chromosomes 13, 18 or 21 was reduced to less than 1 in [id number]. They were counseled that 50-80% of fetuses with Down syndrome and up to 90% of fetuses with trisomies 13 and 18, when well visualized, have detectable anomalies or soft markers by ultrasound.   A complete ultrasound was performed today. The ultrasound report will be sent under separate cover. There were no visualized fetal anomalies or markers suggestive of aneuploidy.  We also discussed the availability of diagnostic testing by way of amniocentesis.  We reviewed the risks, benefits and limitations of amniocentesis including the approximate 1 in 300-500 risk for pregnancy complications following amniocentesis. We discussed the possible results that the tests might provide including: positive, negative, unanticipated, and no result. Finally, they were counseled regarding the cost of each option and potential out of pocket expenses. The couple declined amniocentesis.  They understand that screening tests cannot rule out all birth defects or genetic syndromes. The patient was advised of this limitation and states she still does not want additional testing at this time.   Amy Rodgers was provided with written information regarding cystic fibrosis (CF), spinal muscular atrophy (SMA) and hemoglobinopathies including the carrier frequency, availability of carrier screening and prenatal diagnosis if indicated.  In addition, we discussed that CF and hemoglobinopathies are routinely screened for as part of the  newborn screening panel.  After further discussion, she declined carrier screening.   Both family histories were reviewed and found to be contributory for a paternal first cousin to Amy Rodgers with a birth defect. This baby was reportedly born without a skull and was either stillborn or died shortly after birth. 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 This couple  was counseled that anencephaly occurs in approximately 1 in every 5000 births and represents the most severe end of the neural tube defect spectrum. We discussed that anencephaly is most commonly multifactorial in etiology, due to a combination of both environmental and genetic factors, most of which are largely unknown. We reviewed multifactorial inheritance. Given the reported family history and degree of relation, recurrence risk for an open neural tube defect would not be expected to be increased for the current pregnancy.   Amy Rodgers reported several individuals with mental health conditions in his family. We discussed that for the majority of cases of mental health conditions, such as anxiety, an underlying genetic cause is not known but a combination of genetic and environmental factors (multifactorial inheritance) are suspected to contribute to their onset.  Recurrence risk for first degree relatives is increased above the general population risk, in the case of multifactorial inheritance observed. In some families, mental health conditions may even be dominant, meaning that when one parent has the condition each child could have up to a 50% risk to inherit the condition. We discussed that it might be helpful for pediatricians to be aware of this family history to ensure that family members are followed appropriately. The patient understands that prenatal testing or screening is not available for the majority of mental health conditions. Without further information regarding the provided family history, an accurate genetic risk cannot be calculated. Further genetic counseling is warranted if more information is obtained.  Amy Rodgers denied exposure to environmental toxins or chemical agents. She denied the use of alcohol, tobacco or street drugs. She denied significant viral illnesses during the course of her pregnancy. Her medical and surgical histories were  contributory for gestational diabetes.   I counseled this couple for approximately 30 minutes regarding the above risks and available options.   Quinn Plowman, MS,  Certified Genetic Counselor 07/30/2016

## 2016-07-30 NOTE — ED Notes (Signed)
Pt reports right side back pain, questions that it may be a kidney stone as she has a history of kidney stones.

## 2016-08-01 ENCOUNTER — Other Ambulatory Visit (HOSPITAL_COMMUNITY): Payer: Self-pay

## 2016-11-26 LAB — OB RESULTS CONSOLE GBS: GBS: POSITIVE

## 2016-12-19 ENCOUNTER — Encounter (HOSPITAL_COMMUNITY): Payer: Self-pay | Admitting: *Deleted

## 2016-12-19 ENCOUNTER — Telehealth (HOSPITAL_COMMUNITY): Payer: Self-pay | Admitting: *Deleted

## 2016-12-19 NOTE — Telephone Encounter (Signed)
Preadmission screen  

## 2016-12-23 ENCOUNTER — Other Ambulatory Visit: Payer: Self-pay | Admitting: Obstetrics & Gynecology

## 2016-12-24 ENCOUNTER — Other Ambulatory Visit: Payer: Self-pay | Admitting: Obstetrics & Gynecology

## 2016-12-27 ENCOUNTER — Inpatient Hospital Stay (HOSPITAL_COMMUNITY): Payer: BLUE CROSS/BLUE SHIELD | Admitting: Anesthesiology

## 2016-12-27 ENCOUNTER — Inpatient Hospital Stay (HOSPITAL_COMMUNITY)
Admission: AD | Admit: 2016-12-27 | Discharge: 2016-12-31 | DRG: 765 | Disposition: A | Discharge status: Home / Self Care | Payer: BLUE CROSS/BLUE SHIELD | Source: Ambulatory Visit | Attending: Obstetrics & Gynecology | Admitting: Obstetrics & Gynecology

## 2016-12-27 ENCOUNTER — Encounter (HOSPITAL_COMMUNITY): Payer: Self-pay

## 2016-12-27 DIAGNOSIS — Z3493 Encounter for supervision of normal pregnancy, unspecified, third trimester: Secondary | ICD-10-CM | POA: Diagnosis present

## 2016-12-27 DIAGNOSIS — O9962 Diseases of the digestive system complicating childbirth: Secondary | ICD-10-CM | POA: Diagnosis present

## 2016-12-27 DIAGNOSIS — O9081 Anemia of the puerperium: Secondary | ICD-10-CM | POA: Diagnosis not present

## 2016-12-27 DIAGNOSIS — O99824 Streptococcus B carrier state complicating childbirth: Secondary | ICD-10-CM | POA: Diagnosis present

## 2016-12-27 DIAGNOSIS — O134 Gestational [pregnancy-induced] hypertension without significant proteinuria, complicating childbirth: Secondary | ICD-10-CM | POA: Diagnosis present

## 2016-12-27 DIAGNOSIS — D62 Acute posthemorrhagic anemia: Secondary | ICD-10-CM | POA: Diagnosis not present

## 2016-12-27 DIAGNOSIS — O24425 Gestational diabetes mellitus in childbirth, controlled by oral hypoglycemic drugs: Secondary | ICD-10-CM | POA: Diagnosis present

## 2016-12-27 DIAGNOSIS — K219 Gastro-esophageal reflux disease without esophagitis: Secondary | ICD-10-CM | POA: Diagnosis present

## 2016-12-27 DIAGNOSIS — Z3A39 39 weeks gestation of pregnancy: Secondary | ICD-10-CM

## 2016-12-27 DIAGNOSIS — Z9889 Other specified postprocedural states: Secondary | ICD-10-CM

## 2016-12-27 DIAGNOSIS — Z8249 Family history of ischemic heart disease and other diseases of the circulatory system: Secondary | ICD-10-CM

## 2016-12-27 DIAGNOSIS — O24419 Gestational diabetes mellitus in pregnancy, unspecified control: Secondary | ICD-10-CM

## 2016-12-27 DIAGNOSIS — O280 Abnormal hematological finding on antenatal screening of mother: Secondary | ICD-10-CM

## 2016-12-27 DIAGNOSIS — Z3490 Encounter for supervision of normal pregnancy, unspecified, unspecified trimester: Secondary | ICD-10-CM

## 2016-12-27 HISTORY — DX: Gestational diabetes mellitus in pregnancy, unspecified control: O24.419

## 2016-12-27 LAB — COMPREHENSIVE METABOLIC PANEL
ALBUMIN: 3.1 g/dL — AB (ref 3.5–5.0)
ALK PHOS: 103 U/L (ref 38–126)
ALT: 14 U/L (ref 14–54)
AST: 18 U/L (ref 15–41)
Anion gap: 11 (ref 5–15)
BILIRUBIN TOTAL: 0.6 mg/dL (ref 0.3–1.2)
BUN: 7 mg/dL (ref 6–20)
CALCIUM: 9.1 mg/dL (ref 8.9–10.3)
CO2: 18 mmol/L — AB (ref 22–32)
CREATININE: 0.48 mg/dL (ref 0.44–1.00)
Chloride: 105 mmol/L (ref 101–111)
GFR calc Af Amer: >60 mL/min (ref >60)
GFR calc non Af Amer: >60 mL/min (ref >60)
Glucose, Bld: 106 mg/dL — ABNORMAL HIGH (ref 65–99)
Potassium: 4.3 mmol/L (ref 3.5–5.1)
SODIUM: 134 mmol/L — AB (ref 135–145)
TOTAL PROTEIN: 7.1 g/dL (ref 6.5–8.1)

## 2016-12-27 LAB — CBC
HEMATOCRIT: 35.7 % — AB (ref 36.0–46.0)
HEMOGLOBIN: 12.6 g/dL (ref 12.0–15.0)
MCH: 31.2 pg (ref 26.0–34.0)
MCHC: 35.3 g/dL (ref 30.0–36.0)
MCV: 88.4 fL (ref 78.0–100.0)
Platelets: 241 10*3/uL (ref 150–400)
RBC: 4.04 MIL/uL (ref 3.87–5.11)
RDW: 14.8 % (ref 11.5–15.5)
WBC: 8.5 10*3/uL (ref 4.0–10.5)

## 2016-12-27 LAB — GLUCOSE, CAPILLARY
GLUCOSE-CAPILLARY: 81 mg/dL (ref 65–99)
Glucose-Capillary: 94 mg/dL (ref 65–99)

## 2016-12-27 LAB — PROTEIN / CREATININE RATIO, URINE
Creatinine, Urine: 65 mg/dL
Protein Creatinine Ratio: 0.09 mg/mg{Cre} (ref 0.00–0.15)
Total Protein, Urine: 6 mg/dL

## 2016-12-27 MED ORDER — PENICILLIN G POTASSIUM 5000000 UNITS IJ SOLR: 2.5000 10*6.[IU] | INTRAVENOUS | Status: DC

## 2016-12-27 MED ORDER — MISOPROSTOL 25 MCG QUARTER TABLET: 25.0000 ug | ORAL_TABLET | ORAL | Status: DC | PRN

## 2016-12-27 MED ORDER — SOD CITRATE-CITRIC ACID 500-334 MG/5ML PO SOLN
30.0000 mL | ORAL | Status: DC | PRN
  Administered 2016-12-28 (×2): 30 mL via ORAL
  Filled 2016-12-27 (×2): qty 15

## 2016-12-27 MED ORDER — TERBUTALINE SULFATE 1 MG/ML IJ SOLN
0.2500 mg | Freq: Once | INTRAMUSCULAR | Status: DC | PRN
Start: 2016-12-27 — End: 2016-12-28

## 2016-12-27 MED ORDER — PHENYLEPHRINE 40 MCG/ML (10ML) SYRINGE FOR IV PUSH (FOR BLOOD PRESSURE SUPPORT)
PREFILLED_SYRINGE | INTRAVENOUS | Status: AC
  Filled 2016-12-27: qty 20

## 2016-12-27 MED ORDER — PENICILLIN G POT IN DEXTROSE 60000 UNIT/ML IV SOLN
3.0000 10*6.[IU] | INTRAVENOUS | Status: DC
  Administered 2016-12-27 – 2016-12-28 (×8): 3 10*6.[IU] via INTRAVENOUS
  Filled 2016-12-27 (×10): qty 50

## 2016-12-27 MED ORDER — LACTATED RINGERS IV SOLN: 500.0000 mL | INTRAVENOUS | Status: DC | PRN

## 2016-12-27 MED ORDER — FENTANYL 2.5 MCG/ML BUPIVACAINE 1/10 % EPIDURAL INFUSION (WH - ANES)
INTRAMUSCULAR | Status: AC
  Filled 2016-12-27: qty 100

## 2016-12-27 MED ORDER — LIDOCAINE HCL (PF) 1 % IJ SOLN: 30.0000 mL | INTRAMUSCULAR | Status: DC | PRN

## 2016-12-27 MED ORDER — DIPHENHYDRAMINE HCL 50 MG/ML IJ SOLN: 12.5000 mg | INTRAMUSCULAR | Status: DC | PRN

## 2016-12-27 MED ORDER — FENTANYL 2.5 MCG/ML BUPIVACAINE 1/10 % EPIDURAL INFUSION (WH - ANES)
14.0000 mL/h | INTRAMUSCULAR | Status: DC | PRN
  Administered 2016-12-28 (×3): 14 mL/h via EPIDURAL
  Filled 2016-12-27 (×2): qty 100

## 2016-12-27 MED ORDER — LACTATED RINGERS IV SOLN
INTRAVENOUS | Status: DC
  Administered 2016-12-27: 04:00:00 via INTRAVENOUS

## 2016-12-27 MED ORDER — EPHEDRINE 5 MG/ML INJ: 10.0000 mg | INTRAVENOUS | Status: DC | PRN

## 2016-12-27 MED ORDER — MISOPROSTOL 25 MCG QUARTER TABLET
25.0000 ug | ORAL_TABLET | ORAL | Status: DC
  Filled 2016-12-27: qty 1
  Filled 2016-12-27: qty 0.25
  Filled 2016-12-27 (×3): qty 1

## 2016-12-27 MED ORDER — OXYTOCIN 40 UNITS IN LACTATED RINGERS INFUSION - SIMPLE MED
1.0000 m[IU]/min | INTRAVENOUS | Status: DC
  Administered 2016-12-27: 1 m[IU]/min via INTRAVENOUS
  Administered 2016-12-28: 11 m[IU]/min via INTRAVENOUS
  Administered 2016-12-28: 10 m[IU]/min via INTRAVENOUS
  Filled 2016-12-27: qty 1000

## 2016-12-27 MED ORDER — PENICILLIN G POTASSIUM 5000000 UNITS IJ SOLR
5.0000 10*6.[IU] | Freq: Once | INTRAVENOUS | Status: AC
  Administered 2016-12-27: 5 10*6.[IU] via INTRAVENOUS
  Filled 2016-12-27: qty 5

## 2016-12-27 MED ORDER — LACTATED RINGERS IV SOLN
INTRAVENOUS | Status: DC
  Administered 2016-12-27 (×2): via INTRAVENOUS

## 2016-12-27 MED ORDER — BUTORPHANOL TARTRATE 1 MG/ML IJ SOLN
1.0000 mg | INTRAMUSCULAR | Status: AC | PRN
  Administered 2016-12-27 (×2): 1 mg via INTRAVENOUS
  Filled 2016-12-27 (×2): qty 1

## 2016-12-27 MED ORDER — TERBUTALINE SULFATE 1 MG/ML IJ SOLN: 0.2500 mg | Freq: Once | INTRAMUSCULAR | Status: DC | PRN

## 2016-12-27 MED ORDER — ACETAMINOPHEN 325 MG PO TABS: 650.0000 mg | ORAL_TABLET | ORAL | Status: DC | PRN

## 2016-12-27 MED ORDER — PHENYLEPHRINE 40 MCG/ML (10ML) SYRINGE FOR IV PUSH (FOR BLOOD PRESSURE SUPPORT): 80.0000 ug | PREFILLED_SYRINGE | INTRAVENOUS | Status: DC | PRN

## 2016-12-27 MED ORDER — LACTATED RINGERS IV SOLN
500.0000 mL | Freq: Once | INTRAVENOUS | Status: AC
  Administered 2016-12-27: 500 mL via INTRAVENOUS

## 2016-12-27 MED ORDER — OXYTOCIN 40 UNITS IN LACTATED RINGERS INFUSION - SIMPLE MED: 2.5000 [IU]/h | Freq: Once | INTRAVENOUS | Status: DC | PRN

## 2016-12-27 MED ORDER — ONDANSETRON HCL 4 MG/2ML IJ SOLN
4.0000 mg | Freq: Four times a day (QID) | INTRAMUSCULAR | Status: DC | PRN
  Administered 2016-12-28: 4 mg via INTRAVENOUS
  Filled 2016-12-27: qty 2

## 2016-12-27 MED ORDER — OXYTOCIN BOLUS FROM INFUSION: 500.0000 mL | INTRAVENOUS | Status: DC | PRN

## 2016-12-27 MED ORDER — PENICILLIN G POTASSIUM 5000000 UNITS IJ SOLR: 5.0000 10*6.[IU] | Freq: Once | INTRAVENOUS | Status: DC

## 2016-12-27 NOTE — Progress Notes (Addendum)
Amy Rodgers is a 31 y.o. G1P0 at 8223w3d dated by 1st trim sono. Presented for labor check very uncomfortable but closed, noted to have few elevated BPs in MAU (mainly diastolics 102-104), so admitted for labor AOL as she was planned to have IOL for A2GDM in 2 days.   Subjective: Tried Nitrous oxide for pain but ok now.  Objective: BP 127/80   Pulse 94   Temp 97.8 F (36.6 C) (Oral)   Resp 18   Ht 5\' 4"  (1.626 m)   Wt 202 lb (91.6 kg)   LMP 03/26/2016   BMI 34.67 kg/m    FHT:  FHR: 135 bpm, variability: moderate,  accelerations:  Present,  decelerations:  Absent UC:   irregular, every 2-5 minutes SVE:   Dilation: 1 Effacement (%): Thick Station: -3 Exam by:: Dr. Juliene Rodgers Cervical Foley balloon placed and tolerated well.   Labs: Lab Results  Component Value Date   WBC 8.5 12/27/2016   HGB 12.6 12/27/2016   HCT 35.7 (L) 12/27/2016   MCV 88.4 12/27/2016   PLT 241 12/27/2016   CBG 2hr post breakfast 81  Assessment / Plan: Augmentation of labor, progressing well. Early labor. 39.3 wks, admitted in early labor due to elevated BPs but they are stable. A2GDM based on early Glucola at 16 wks, on Metformin since 1st trimester, now on low dose at 500mg  at bedtime, took her dose last night. Cervical balloon placed, continue pitocin Recommend not use Nitrous this early in labor, instead plan Stadol 1 mg every hours Needs to sit on ball or ambulate as station is high but well applied to cervix with contraction.  EFW 6.1/2- 7 lbs, anticipate SVD but assess descent in active labor.   Amy Rodgers 12/27/2016, 12:04 PM

## 2016-12-27 NOTE — H&P (Signed)
Amy Rodgers is a 31 y.o. female presented to MAU for contractions and pain.  BP was noted to be elevated x2, diastolic only and pt reported RUQ pain.  She is G1, 39.2 wks with A2GDM on Metformin for induction on 2/10 night. So decision was made to admit and augment labor. Good FMs, no LOF or bleeding. No HA/ vision changes. BP was noted to slightly elevated in 130s/ 80s range since 37 wk visit.  Pt has PCOS, infertility, this is Femara/Metformin conception after 3 cycles.  Abn QUAD, normal [name], saw MFM and genetic counselor and further intervention deferred as [name] was normal for Down's Early Glucola at 16 wks off Metformin was 160, so treating as GDM since 2nd trim, diet, exercise and Metformin 500mg  at bedtime, excellent control with occasional high fastings. Serial growth sono normal, AGA, last EFW 5'9" at 62% at 35 wks.  OB History    Gravida Para Term Preterm AB Living   1             SAB TAB Ectopic Multiple Live Births                 Past Medical History:  Diagnosis Date  . Anemia    Borderline  . Anxiety   . Asthma   . Complication of anesthesia    "freaks out waking up from surgery"  . Depression   . GERD (gastroesophageal reflux disease)   . Gestational diabetes   . IBS (irritable bowel syndrome)   . Kidney stones   . Mononucleosis   . Pneumonia    10/16  . PONV (postoperative nausea and vomiting)   . Pre-diabetes    Past Surgical History:  Procedure Laterality Date  . BACK SURGERY    . CHROMOPERTUBATION Bilateral 10/26/2015   Procedure: CHROMOPERTUBATION;  Surgeon: Shea EvansVaishali Ulyess Muto, MD;  Location: [facility];  Service: Gynecology;  Laterality: Bilateral;  . COLONOSCOPY    . Kidney stent    . KNEE SURGERY Left   . LAPAROSCOPY N/A 10/26/2015   Procedure: LAPAROSCOPY DIAGNOSTIC ;  Surgeon: Shea EvansVaishali Tonni Mansour, MD;  Location: [facility];  Service: Gynecology;  Laterality: N/A;  . TONSILLECTOMY    . UPPER GASTROINTESTINAL ENDOSCOPY     Family History: family history  includes Cancer in her maternal aunt and maternal grandmother; Heart disease in her father; Hypertension in her father and mother. Social History:  reports that she has never smoked. She has never used smokeless tobacco. She reports that she does not drink alcohol or use drugs.     Maternal Diabetes: Yes:  Diabetes Type:  Insulin/Medication controlled  Metformin 500mg  at bedtime, good control Genetic Screening: Normal [name] (abn QUAD for Down's, but [name] was normal) Maternal Ultrasounds/Referrals: Normal Fetal Ultrasounds or other Referrals:  Saw MFM and Genetic counselor for abn QUAD screen. Normal anatomy and Normal [name] Maternal Substance Abuse:  No Significant Maternal Medications:  Meds include: Other:  Metformin 500g at night, Zantac 150mg   Significant Maternal Lab Results:  Lab values include: Group B Strep positive Other Comments:  None  ROS No HA/ vision changes/ RUQ pain seems to be with FMs History Dilation: 1 Effacement (%): Thick Station: -3 Exam by:: Margret ChanceNora Weatherby, RN Blood pressure (!) 128/92, pulse 77, temperature 97.8 F (36.6 C), temperature source Oral, resp. rate 18, height 5\' 4"  (1.626 m), weight 202 lb (91.6 kg), last menstrual period 03/26/2016. Exam Physical Exam  Physical exam:  A&O x 3, no acute distress. Pleasant HEENT neg, no thyromegaly  Lungs CTA bilat CV RRR, S1S2 normal Abdo soft, non tender, non acute Extr no edema/ tenderness Pelvic 1-2 cm per this morning RN FHT  135/ + acccels / no decels/ mod variab- cat I Toco irreg 1-5 min, spaced out this morning, will start pitocin  Prenatal labs: ABO, Rh: O/Positive/-- (07/19 0000) Antibody: Negative (07/19 0000) Rubella: Immune (07/19 0000) RPR: Nonreactive (07/19 0000)  HBsAg: Negative (07/19 0000)  HIV: Non-reactive (07/19 0000)  GBS: Positive (01/09 0000)   Assessment/Plan: 31 yo female G1, at 39.2 wls. With A2GDM, earrly labor, admitted for few elevated BPs and contractions  with pain, BPs stable, early labor, start pitocin to augment, GBS(+), start PCN per protocol.  FHT cat I EFW 6.1/2  lbs   Amy Rodgers R 12/27/2016, 8:24 AM

## 2016-12-27 NOTE — Anesthesia Pain Management Evaluation Note (Signed)
  [facility] Pain Management Visit Note  Patient: Amy Rodgers, 31 y.o., female  "Hello I am a member of the anesthesia team at [facility]. We have an anesthesia team available at all times to provide care throughout the hospital, including epidural management and anesthesia for C-section. I don't know your plan for the delivery whether it a natural birth, water birth, IV sedation, nitrous supplementation, doula or epidural, but we want to meet your pain goals."   1.Was your pain managed to your expectations on prior hospitalizations?   No prior hospitalizations  2.What is your expectation for pain management during this hospitalization?     Epidural and Nitrous Oxide  3.How can we help you reach that goal? N2O, epidural if desired.  Record the patient's initial score and the patient's pain goal.   Pain: 5  Pain Goal: 7 [facility] wants you to be able to say your pain was always managed very well.  Dustan Hyams L 12/27/2016

## 2016-12-27 NOTE — MAU Provider Note (Signed)
History     CSN: [id number]  Arrival date and time: 12/27/16 0246   First Provider Initiated Contact with Patient 12/27/16 0319      Chief Complaint  Patient presents with  . Contractions   Amy Schimkeshley M Stang is a 31 y.o. G1P0 at 3138w3d who presents today for a labor evaluation. She started having regular contractions around 0000. Upon arrival here she is noted to have some elevated blood pressures. She states that she had one elevated blood pressure in the office on 12/24/16. She states that it was 130/90, and then 124/84. She denies any VB or LOF. She reports normal fetal movement. She denies any HA or visual distrubances. She reports some RUQ pain, she thought it was from the baby pressing in that area. Patient report GDM on metformin 500mg  QD.    Past Medical History:  Diagnosis Date  . Anemia    Borderline  . Anxiety   . Asthma   . Complication of anesthesia    "freaks out waking up from surgery"  . Depression   . GERD (gastroesophageal reflux disease)   . Gestational diabetes   . IBS (irritable bowel syndrome)   . Kidney stones   . Mononucleosis   . Pneumonia    10/16  . PONV (postoperative nausea and vomiting)   . Pre-diabetes     Past Surgical History:  Procedure Laterality Date  . BACK SURGERY    . CHROMOPERTUBATION Bilateral 10/26/2015   Procedure: CHROMOPERTUBATION;  Surgeon: Shea EvansVaishali Mody, MD;  Location: [facility];  Service: Gynecology;  Laterality: Bilateral;  . COLONOSCOPY    . Kidney stent    . KNEE SURGERY Left   . LAPAROSCOPY N/A 10/26/2015   Procedure: LAPAROSCOPY DIAGNOSTIC ;  Surgeon: Shea EvansVaishali Mody, MD;  Location: [facility];  Service: Gynecology;  Laterality: N/A;  . TONSILLECTOMY    . UPPER GASTROINTESTINAL ENDOSCOPY      Family History  Problem Relation Age of Onset  . Hypertension Mother   . Hypertension Father   . Heart disease Father   . Cancer Maternal Aunt   . Cancer Maternal Grandmother     Social History  Substance Use Topics  . Smoking  status: Never Smoker  . Smokeless tobacco: Never Used  . Alcohol use No    Allergies:  Allergies  Allergen Reactions  . Codeine Other (See Comments)    headaches    Prescriptions Prior to Admission  Medication Sig Dispense Refill Last Dose  . aluminum-magnesium hydroxide-simethicone (MAALOX) 200-200-20 MG/5ML SUSP Take 30 mLs by mouth 3 (three) times daily as needed (for upset stomach).   Taking  . Doxylamine-Pyridoxine (DICLEGIS PO) Take by mouth.   Taking  . metFORMIN (GLUCOPHAGE) 500 MG tablet Take 500 mg by mouth at bedtime.   Taking  . polyethylene glycol (MIRALAX / GLYCOLAX) packet Take 17 g by mouth daily.   Taking  . Prenatal Vit-Fe Fumarate-FA (PRENATAL MULTIVITAMIN) TABS tablet Take 1 tablet by mouth at bedtime.   Taking  . promethazine (PHENERGAN) 25 MG tablet Take 1 tablet (25 mg total) by mouth every 6 (six) hours as needed. (Patient not taking: Reported on 07/30/2016) 30 tablet 1 Not Taking    Review of Systems  Constitutional: Negative for fever.  Gastrointestinal: Positive for abdominal pain and nausea. Negative for vomiting.  Genitourinary: Negative for vaginal bleeding and vaginal discharge.   Physical Exam   Blood pressure (!) 126/102, pulse 118, temperature 97.4 F (36.3 C), temperature source Oral, resp. rate 18, last menstrual  period 03/26/2016.  Physical Exam  Nursing note and vitals reviewed. Constitutional: She is oriented to person, place, and time. She appears well-developed and well-nourished. No distress.  HENT:  Head: Normocephalic.  Cardiovascular: Normal rate.   Respiratory: Effort normal.  GI: Soft. There is no tenderness. There is no rebound.  Neurological: She is alert and oriented to person, place, and time. She has normal reflexes. She exhibits normal muscle tone (no clonus ).  Skin: Skin is warm and dry.    MAU Course  Procedures  MDM 0335: RN called report to Dr. Juliene Pina will admit to labor and delivery. Call with lab results.    Assessment and Plan  Early labor at term Elevated blood pressure Labs pending Admit to labor and delivery   Amy Rodgers 12/27/2016, 3:21 AM

## 2016-12-27 NOTE — Anesthesia Preprocedure Evaluation (Addendum)
Anesthesia Evaluation  Patient identified by MRN, date of birth, ID band Patient awake    Reviewed: Allergy & Precautions, H&P , Patient's Chart, lab work & pertinent test results  Airway Mallampati: II  TM Distance: >3 FB Neck ROM: full    Dental no notable dental hx.    Pulmonary    Pulmonary exam normal        Cardiovascular negative cardio ROS Normal cardiovascular exam     Neuro/Psych negative neurological ROS     GI/Hepatic Neg liver ROS,   Endo/Other  negative endocrine ROSdiabetes  Renal/GU      Musculoskeletal   Abdominal (+) + obese,   Peds  Hematology   Anesthesia Other Findings   Reproductive/Obstetrics (+) Pregnancy                            Anesthesia Physical Anesthesia Plan  ASA: II  Anesthesia Plan: Epidural   Post-op Pain Management:    Induction:   Airway Management Planned:   Additional Equipment:   Intra-op Plan:   Post-operative Plan:   Informed Consent: I have reviewed the patients History and Physical, chart, labs and discussed the procedure including the risks, benefits and alternatives for the proposed anesthesia with the patient or authorized representative who has indicated his/her understanding and acceptance.     Plan Discussed with:   Anesthesia Plan Comments:         Anesthesia Quick Evaluation

## 2016-12-27 NOTE — Progress Notes (Signed)
Subjective: G1, at 39.2 wks.  With A2GDM/Mild PIH/GBS pos Pen G Doing well, pain controled with Nitrous Oxide, UCs q2-3 min  Anesthesia none   Objective: BP 115/78   Pulse 80   Temp 97.7 F (36.5 C) (Oral)   Resp 16   Ht 5\' 4"  (1.626 m)   Wt 202 lb (91.6 kg)   LMP 03/26/2016   BMI 34.67 kg/m    FHT:  FHR: 140's bpm, variability: moderate,  accelerations:  Present,  decelerations:  Absent UC:   regular, every 2-3 minutes  VE:   Dilation: 3 Effacement (%): Thick Station: -3 Exam by:: Dr. Seymour BarsLavoie (Foley Bulb still in)   Assessment / Plan: G1, at 39.2 wks with A2GDM and mild PIH.  GBS pos, covered with Pen G.  Early labor on Pitocin with Dilation of cervix with Foley.  Traction increased, will continue until it falls out.  Fetal Wellbeing:  Category I Pain Control:  Nitrous Oxide  Anticipated MOD:  NSVD  Amy Rodgers 12/27/2016, 6:46 PM

## 2016-12-27 NOTE — MAU Note (Signed)
Patient presents with c/o ctx every 10 mins. Patient denies any bleeding or LOF. Fetus active. GBS +. Gestational diabetic.

## 2016-12-28 ENCOUNTER — Encounter (HOSPITAL_COMMUNITY)
Admission: AD | Disposition: A | Discharge status: Home / Self Care | Payer: Self-pay | Source: Ambulatory Visit | Attending: Obstetrics & Gynecology

## 2016-12-28 DIAGNOSIS — Z9889 Other specified postprocedural states: Secondary | ICD-10-CM

## 2016-12-28 LAB — GLUCOSE, CAPILLARY
GLUCOSE-CAPILLARY: 98 mg/dL (ref 65–99)
GLUCOSE-CAPILLARY: 93 mg/dL (ref 65–99)
GLUCOSE-CAPILLARY: 72 mg/dL (ref 65–99)

## 2016-12-28 SURGERY — Surgical Case
Anesthesia: Epidural | Wound class: Clean Contaminated

## 2016-12-28 MED ORDER — SIMETHICONE 80 MG PO CHEW
80.0000 mg | CHEWABLE_TABLET | Freq: Three times a day (TID) | ORAL | Status: DC
  Administered 2016-12-29 – 2016-12-31 (×7): 80 mg via ORAL
  Filled 2016-12-28 (×6): qty 1

## 2016-12-28 MED ORDER — SCOPOLAMINE 1 MG/3DAYS TD PT72
1.0000 | MEDICATED_PATCH | Freq: Once | TRANSDERMAL | Status: DC
  Filled 2016-12-28: qty 1

## 2016-12-28 MED ORDER — ONDANSETRON HCL 4 MG/2ML IJ SOLN: 4.0000 mg | Freq: Three times a day (TID) | INTRAMUSCULAR | Status: DC | PRN

## 2016-12-28 MED ORDER — SCOPOLAMINE 1 MG/3DAYS TD PT72
MEDICATED_PATCH | TRANSDERMAL | Status: DC | PRN
  Administered 2016-12-28: 1 via TRANSDERMAL

## 2016-12-28 MED ORDER — NALBUPHINE HCL 10 MG/ML IJ SOLN: 5.0000 mg | Freq: Once | INTRAMUSCULAR | Status: DC | PRN

## 2016-12-28 MED ORDER — NALBUPHINE HCL 10 MG/ML IJ SOLN: 5.0000 mg | INTRAMUSCULAR | Status: DC | PRN

## 2016-12-28 MED ORDER — MEPERIDINE HCL 25 MG/ML IJ SOLN
INTRAMUSCULAR | Status: AC
  Filled 2016-12-28: qty 1

## 2016-12-28 MED ORDER — MEPERIDINE HCL 25 MG/ML IJ SOLN
INTRAMUSCULAR | Status: DC | PRN
  Administered 2016-12-28 (×2): 12.5 mg via INTRAVENOUS

## 2016-12-28 MED ORDER — DEXAMETHASONE SODIUM PHOSPHATE 4 MG/ML IJ SOLN
INTRAMUSCULAR | Status: DC | PRN
  Administered 2016-12-28: 4 mg via INTRAVENOUS

## 2016-12-28 MED ORDER — MEPERIDINE HCL 25 MG/ML IJ SOLN
6.2500 mg | INTRAMUSCULAR | Status: DC | PRN
  Administered 2016-12-28: 6.25 mg via INTRAVENOUS

## 2016-12-28 MED ORDER — BUPIVACAINE HCL 0.25 % IJ SOLN
INTRAMUSCULAR | Status: DC | PRN
  Administered 2016-12-28: 10 mL

## 2016-12-28 MED ORDER — DEXAMETHASONE SODIUM PHOSPHATE 4 MG/ML IJ SOLN
INTRAMUSCULAR | Status: AC
  Filled 2016-12-28: qty 1

## 2016-12-28 MED ORDER — MENTHOL 3 MG MT LOZG: 1.0000 | LOZENGE | OROMUCOSAL | Status: DC | PRN

## 2016-12-28 MED ORDER — CEFAZOLIN SODIUM-DEXTROSE 2-3 GM-% IV SOLR
INTRAVENOUS | Status: DC | PRN
  Administered 2016-12-28: 2 g via INTRAVENOUS

## 2016-12-28 MED ORDER — MORPHINE SULFATE (PF) 0.5 MG/ML IJ SOLN
INTRAMUSCULAR | Status: AC
  Filled 2016-12-28: qty 10

## 2016-12-28 MED ORDER — SIMETHICONE 80 MG PO CHEW
80.0000 mg | CHEWABLE_TABLET | ORAL | Status: DC
  Administered 2016-12-29 – 2016-12-31 (×3): 80 mg via ORAL
  Filled 2016-12-28 (×3): qty 1

## 2016-12-28 MED ORDER — FENTANYL CITRATE (PF) 100 MCG/2ML IJ SOLN: 25.0000 ug | INTRAMUSCULAR | Status: DC | PRN

## 2016-12-28 MED ORDER — SODIUM CHLORIDE 0.9% FLUSH: 3.0000 mL | INTRAVENOUS | Status: DC | PRN

## 2016-12-28 MED ORDER — SCOPOLAMINE 1 MG/3DAYS TD PT72
MEDICATED_PATCH | TRANSDERMAL | Status: AC
  Filled 2016-12-28: qty 1

## 2016-12-28 MED ORDER — MAGNESIUM HYDROXIDE 400 MG/5ML PO SUSP: 30.0000 mL | ORAL | Status: DC | PRN

## 2016-12-28 MED ORDER — OXYTOCIN 10 UNIT/ML IJ SOLN
INTRAVENOUS | Status: DC | PRN
  Administered 2016-12-28: 40 [IU] via INTRAVENOUS

## 2016-12-28 MED ORDER — KETOROLAC TROMETHAMINE 30 MG/ML IJ SOLN
INTRAMUSCULAR | Status: AC
  Administered 2016-12-28: 30 mg
  Filled 2016-12-28: qty 1

## 2016-12-28 MED ORDER — NALOXONE HCL 2 MG/2ML IJ SOSY
1.0000 ug/kg/h | PREFILLED_SYRINGE | INTRAVENOUS | Status: DC | PRN
  Filled 2016-12-28: qty 2

## 2016-12-28 MED ORDER — ACETAMINOPHEN 325 MG PO TABS
650.0000 mg | ORAL_TABLET | ORAL | Status: DC | PRN
  Administered 2016-12-29 – 2016-12-31 (×2): 650 mg via ORAL
  Filled 2016-12-28 (×2): qty 2

## 2016-12-28 MED ORDER — PRENATAL MULTIVITAMIN CH
1.0000 | ORAL_TABLET | Freq: Every day | ORAL | Status: DC
  Administered 2016-12-29 – 2016-12-30 (×2): 1 via ORAL
  Filled 2016-12-28 (×3): qty 1

## 2016-12-28 MED ORDER — WITCH HAZEL-GLYCERIN EX PADS: 1.0000 "application " | MEDICATED_PAD | CUTANEOUS | Status: DC | PRN

## 2016-12-28 MED ORDER — OXYCODONE HCL 5 MG PO TABS
5.0000 mg | ORAL_TABLET | ORAL | Status: DC | PRN
  Administered 2016-12-30 (×5): 5 mg via ORAL
  Filled 2016-12-28 (×5): qty 1

## 2016-12-28 MED ORDER — ZOLPIDEM TARTRATE 5 MG PO TABS: 5.0000 mg | ORAL_TABLET | Freq: Every evening | ORAL | Status: DC | PRN

## 2016-12-28 MED ORDER — DIBUCAINE 1 % RE OINT: 1.0000 "application " | TOPICAL_OINTMENT | RECTAL | Status: DC | PRN

## 2016-12-28 MED ORDER — LIDOCAINE-EPINEPHRINE (PF) 2 %-1:200000 IJ SOLN
INTRAMUSCULAR | Status: DC | PRN
  Administered 2016-12-28 (×3): 5 mL via INTRADERMAL

## 2016-12-28 MED ORDER — BUPIVACAINE HCL (PF) 0.25 % IJ SOLN
INTRAMUSCULAR | Status: AC
  Filled 2016-12-28: qty 30

## 2016-12-28 MED ORDER — OXYCODONE HCL 5 MG PO TABS
10.0000 mg | ORAL_TABLET | ORAL | Status: DC | PRN
  Administered 2016-12-30 – 2016-12-31 (×4): 10 mg via ORAL
  Filled 2016-12-28 (×4): qty 2

## 2016-12-28 MED ORDER — TETANUS-DIPHTH-ACELL PERTUSSIS 5-2.5-18.5 LF-MCG/0.5 IM SUSP: 0.5000 mL | Freq: Once | INTRAMUSCULAR | Status: DC

## 2016-12-28 MED ORDER — COCONUT OIL OIL
1.0000 "application " | TOPICAL_OIL | Status: DC | PRN
  Administered 2016-12-29: 1 via TOPICAL
  Filled 2016-12-28: qty 120

## 2016-12-28 MED ORDER — PHENYLEPHRINE HCL 10 MG/ML IJ SOLN
INTRAMUSCULAR | Status: DC | PRN
  Administered 2016-12-28 (×2): 40 ug via INTRAVENOUS
  Administered 2016-12-28 (×2): 80 ug via INTRAVENOUS
  Administered 2016-12-28: 40 ug via INTRAVENOUS
  Administered 2016-12-28: 120 ug via INTRAVENOUS
  Administered 2016-12-28 (×2): 80 ug via INTRAVENOUS
  Administered 2016-12-28: 40 ug via INTRAVENOUS
  Administered 2016-12-28: 80 ug via INTRAVENOUS
  Administered 2016-12-28: 40 ug via INTRAVENOUS
  Administered 2016-12-28: 80 ug via INTRAVENOUS

## 2016-12-28 MED ORDER — DIPHENHYDRAMINE HCL 25 MG PO CAPS: 25.0000 mg | ORAL_CAPSULE | Freq: Four times a day (QID) | ORAL | Status: DC | PRN

## 2016-12-28 MED ORDER — IBUPROFEN 600 MG PO TABS
600.0000 mg | ORAL_TABLET | Freq: Four times a day (QID) | ORAL | Status: DC
  Administered 2016-12-29 – 2016-12-31 (×10): 600 mg via ORAL
  Filled 2016-12-28 (×11): qty 1

## 2016-12-28 MED ORDER — KETOROLAC TROMETHAMINE 30 MG/ML IJ SOLN: 30.0000 mg | Freq: Four times a day (QID) | INTRAMUSCULAR | Status: DC | PRN

## 2016-12-28 MED ORDER — NALOXONE HCL 0.4 MG/ML IJ SOLN: 0.4000 mg | INTRAMUSCULAR | Status: DC | PRN

## 2016-12-28 MED ORDER — SIMETHICONE 80 MG PO CHEW: 80.0000 mg | CHEWABLE_TABLET | ORAL | Status: DC | PRN

## 2016-12-28 MED ORDER — FERROUS SULFATE 325 (65 FE) MG PO TABS
325.0000 mg | ORAL_TABLET | Freq: Two times a day (BID) | ORAL | Status: DC
  Administered 2016-12-29 – 2016-12-31 (×5): 325 mg via ORAL
  Filled 2016-12-28 (×5): qty 1

## 2016-12-28 MED ORDER — LACTATED RINGERS IV SOLN: INTRAVENOUS | Status: DC

## 2016-12-28 MED ORDER — SENNOSIDES-DOCUSATE SODIUM 8.6-50 MG PO TABS
2.0000 | ORAL_TABLET | ORAL | Status: DC
  Administered 2016-12-29 – 2016-12-31 (×3): 2 via ORAL
  Filled 2016-12-28 (×3): qty 2

## 2016-12-28 MED ORDER — MORPHINE SULFATE (PF) 0.5 MG/ML IJ SOLN
INTRAMUSCULAR | Status: DC | PRN
  Administered 2016-12-28: 4 mg via EPIDURAL

## 2016-12-28 MED ORDER — LIDOCAINE HCL (PF) 1 % IJ SOLN
INTRAMUSCULAR | Status: DC | PRN
  Administered 2016-12-27: 6 mL via EPIDURAL
  Administered 2016-12-27: 7 mL via EPIDURAL

## 2016-12-28 MED ORDER — DIPHENHYDRAMINE HCL 50 MG/ML IJ SOLN
12.5000 mg | INTRAMUSCULAR | Status: DC | PRN
  Administered 2016-12-28: 12.5 mg via INTRAVENOUS
  Filled 2016-12-28: qty 1

## 2016-12-28 MED ORDER — LACTATED RINGERS IV SOLN
INTRAVENOUS | Status: DC | PRN
  Administered 2016-12-28 (×2): via INTRAVENOUS

## 2016-12-28 MED ORDER — OXYTOCIN 10 UNIT/ML IJ SOLN
INTRAMUSCULAR | Status: AC
  Filled 2016-12-28: qty 4

## 2016-12-28 MED ORDER — DIPHENHYDRAMINE HCL 25 MG PO CAPS: 25.0000 mg | ORAL_CAPSULE | ORAL | Status: DC | PRN

## 2016-12-28 MED ORDER — ONDANSETRON HCL 4 MG/2ML IJ SOLN: 4.0000 mg | Freq: Four times a day (QID) | INTRAMUSCULAR | Status: DC | PRN

## 2016-12-28 MED ORDER — ONDANSETRON HCL 4 MG/2ML IJ SOLN
INTRAMUSCULAR | Status: AC
  Filled 2016-12-28: qty 2

## 2016-12-28 MED ORDER — ONDANSETRON HCL 4 MG/2ML IJ SOLN
INTRAMUSCULAR | Status: DC | PRN
  Administered 2016-12-28: 4 mg via INTRAVENOUS

## 2016-12-28 MED ORDER — OXYTOCIN 40 UNITS IN LACTATED RINGERS INFUSION - SIMPLE MED: 2.5000 [IU]/h | INTRAVENOUS | Status: DC

## 2016-12-28 SURGICAL SUPPLY — 42 items
ADH SKN CLS APL DERMABOND .7 (GAUZE/BANDAGES/DRESSINGS) ×1
APL SKNCLS STERI-STRIP NONHPOA (GAUZE/BANDAGES/DRESSINGS) ×1
BENZOIN TINCTURE PRP APPL 2/3 (GAUZE/BANDAGES/DRESSINGS) ×2 IMPLANT
CHLORAPREP W/TINT 26ML (MISCELLANEOUS) ×2 IMPLANT
CLAMP CORD UMBIL (MISCELLANEOUS) IMPLANT
CLOSURE STERI STRIP 1/2 X4 (GAUZE/BANDAGES/DRESSINGS) ×2 IMPLANT
CLOTH BEACON ORANGE TIMEOUT ST (SAFETY) ×2 IMPLANT
CONTAINER PREFILL 10% NBF 15ML (MISCELLANEOUS) IMPLANT
DERMABOND ADVANCED (GAUZE/BANDAGES/DRESSINGS) ×1
DERMABOND ADVANCED .7 DNX12 (GAUZE/BANDAGES/DRESSINGS) ×1 IMPLANT
DRSG OPSITE POSTOP 4X10 (GAUZE/BANDAGES/DRESSINGS) ×2 IMPLANT
ELECT REM PT RETURN 9FT ADLT (ELECTROSURGICAL) ×2
ELECTRODE REM PT RTRN 9FT ADLT (ELECTROSURGICAL) ×1 IMPLANT
EXTRACTOR VACUUM M CUP 4 TUBE (SUCTIONS) IMPLANT
GLOVE BIO SURGEON STRL SZ 6.5 (GLOVE) ×2 IMPLANT
GLOVE BIOGEL PI IND STRL 7.0 (GLOVE) ×2 IMPLANT
GLOVE BIOGEL PI INDICATOR 7.0 (GLOVE) ×2
GOWN STRL REUS W/TWL LRG LVL3 (GOWN DISPOSABLE) ×4 IMPLANT
KIT ABG SYR 3ML LUER SLIP (SYRINGE) IMPLANT
NDL HYPO 25X5/8 SAFETYGLIDE (NEEDLE) IMPLANT
NEEDLE HYPO 22GX1.5 SAFETY (NEEDLE) ×2 IMPLANT
NEEDLE HYPO 25X5/8 SAFETYGLIDE (NEEDLE) IMPLANT
PACK C SECTION WH (CUSTOM PROCEDURE TRAY) ×2 IMPLANT
PAD OB MATERNITY 4.3X12.25 (PERSONAL CARE ITEMS) ×2 IMPLANT
RTRCTR C-SECT PINK 25CM LRG (MISCELLANEOUS) ×2 IMPLANT
SUT MNCRL AB 3-0 PS2 27 (SUTURE) ×2 IMPLANT
SUT MON AB 4-0 PS1 27 (SUTURE) IMPLANT
SUT PLAIN 0 NONE (SUTURE) IMPLANT
SUT PLAIN 2 0 (SUTURE) ×2
SUT PLAIN ABS 2-0 CT1 27XMFL (SUTURE) ×1 IMPLANT
SUT VIC AB 0 CT1 27 (SUTURE) ×4
SUT VIC AB 0 CT1 27XBRD ANBCTR (SUTURE) ×2 IMPLANT
SUT VIC AB 0 CTX 36 (SUTURE) ×10
SUT VIC AB 0 CTX36XBRD ANBCTRL (SUTURE) ×2 IMPLANT
SUT VIC AB 2-0 CT1 27 (SUTURE) ×2
SUT VIC AB 2-0 CT1 TAPERPNT 27 (SUTURE) ×1 IMPLANT
SUT VIC AB 3-0 SH 27 (SUTURE)
SUT VIC AB 3-0 SH 27X BRD (SUTURE) IMPLANT
SUT VIC AB 4-0 PS2 27 (SUTURE) IMPLANT
SYR CONTROL 10ML LL (SYRINGE) ×2 IMPLANT
TOWEL OR 17X24 6PK STRL BLUE (TOWEL DISPOSABLE) ×2 IMPLANT
TRAY FOLEY CATH SILVER 14FR (SET/KITS/TRAYS/PACK) ×2 IMPLANT

## 2016-12-28 NOTE — Op Note (Signed)
Preoperative diagnosis: Intrauterine pregnancy at 39 4/[redacted] weeks                                            Gestational Diabetes Mellitus A2                                            Mild Pregnancy Induced Hypertension                                            Arrest of progression in active phase of labor  Post operative diagnosis: Same  Anesthesia: Epidural  Anesthesiologist: Dr. Lorelee Market  Procedure: Primary urgent low transverse cesarean section  Surgeon: Dr. Genia Del  Assistant: Marlinda Mike  Estimated blood loss: 750 cc  Procedure:  After being informed of the planned procedure and possible complications including bleeding, infection, injury to other organs, informed consent is obtained. The patient is taken to OR #9 and her epidural anesthesia level is raised without complication. She is placed in the dorsal decubitus position with the pelvis tilted to the left. She is then prepped and draped in a sterile fashion. A Foley catheter is already inserted in her bladder.  After assessing adequate level of anesthesia, we infiltrate the suprapubic area with 10 cc of Marcaine 0.25 and perform a Pfannenstiel incision which is brought down sharply to the fascia. The fascia is entered in a low transverse fashion. Linea alba is dissected. Peritoneum is entered in a midline fashion. An Alexis retractor is easily positioned. Visceral peritoneum is entered in a low transverse fashion allowing [place] to safely retract bladder by developing a bladder flap.  The myometrium is then entered in a low transverse fashion; first with knife and then extended bluntly. Amniotic fluid is tinted meconium. We assist the birth of a female  infant in cephalic presentation. Mouth and nose are suctioned. The baby is delivered.  2 loose nuchal cords are present. The cord is clamped and sectioned after 1 minute. The baby is given to the neonatologist present in the room.  10 cc of blood is drawn from the  umbilical vein.The placenta is allowed to deliver spontaneously. It is complete and the cord has 3 vessels. Uterine revision is negative.  We proceed with closure of the myometrium in 2 layers: First with a running locked suture of 0 Vicryl, then with a Lembert suture of 0 Vicryl imbricating the first one. Hemostasis is completed with 1 figure of eight with Vicryl 0 at the right aspect of the uterine incision and cauterization on peritoneal edges.  Both paracolic gutters are cleaned. Both tubes and ovaries are assessed and normal.  We confirm a satisfactory hemostasis.  Retractors and sponges are removed. Under fascia hemostasis is completed with cauterization.  The parietal peritoneum is closed with a running suture of Vicryl 2-0.  The fascia is then closed with 2 running sutures of 0 Vicryl meeting midline.  Hemostasis is completed with cauterization.  The adipose tissue is approximated with a running Plain 2-0. The skin is closed with a subcuticular suture of Vicryl 4-0 and Dermabond.  A Honeycomb dressing is applied.  Instrument and sponge count  is complete x2. Estimated blood loss is 750 cc.  The procedure is well tolerated by the patient who is taken to recovery room in a well and stable condition.  female baby was born at 19:31 and received an Apgar of 9  at 1 minute and 10 at 5 minutes. Weight was pending.    Specimen: Placenta sent to L & D   Genia DelMarie-Lyne Delanie Tirrell MD 2/10/20188:15 PM

## 2016-12-28 NOTE — Anesthesia Procedure Notes (Addendum)
Epidural Patient location during procedure: OB Start time: 12/28/2016 11:52 PM End time: 12/28/2016 11:57 PM  Staffing Anesthesiologist: Leilani AbleHATCHETT, Vitoria Conyer Performed: anesthesiologist   Preanesthetic Checklist Completed: patient identified, surgical consent, pre-op evaluation, timeout performed, IV checked, risks and benefits discussed and monitors and equipment checked  Epidural Patient position: sitting Prep: site prepped and draped and DuraPrep Patient monitoring: continuous pulse ox and blood pressure Approach: midline Location: L3-L4 Injection technique: LOR air  Needle:  Needle type: Tuohy  Needle gauge: 17 G Needle length: 9 cm and 9 Needle insertion depth: 7 cm Catheter type: closed end flexible Catheter size: 19 Gauge Catheter at skin depth: 12 cm Test dose: negative and Other  Assessment Sensory level: T9 Events: blood not aspirated, injection not painful, no injection resistance, negative IV test and no paresthesia  Additional Notes Reason for block:procedure for pain

## 2016-12-28 NOTE — Lactation Note (Signed)
This note was copied from a baby's chart. Lactation Consultation Note  Patient Name: Amy Rodgers [id number]'s Date: 12/28/2016 Reason for consult: Initial assessment Baby at 2 hr of life. Baby was able to latch to the R breast but was using a NS of the L breast per RN. Upon entry baby was latched to the L breast well. Baby came off when mom was having her bleeding checked and there was notable colostrum in the NS along with baby's mouth. Mom is on Metformin, she has been during pregnancy, she only wants to bf 24-48 hr. She stated "I do not want the baby to have anything bad" referring to the Metformin. She wants to give Enfamil because it has DHA in it. Tried to explain that the [facility] is from an artificial source and is not the same as what is in her milk. Tried to talk about the benefits of breast milk go beyond the first couple of days. She understands her milk will come in and she plans to pump and dump. This consult happened in PACU and may not have been the best time to talk about the risks of formula.   Maternal Data Has patient been taught Hand Expression?: No Does the patient have breastfeeding experience prior to this delivery?: No  Feeding Feeding Type: Breast Fed Length of feed: 5 min  LATCH Score/Interventions Latch: Grasps breast easily, tongue down, lips flanged, rhythmical sucking. Intervention(s): Adjust position  Audible Swallowing: A few with stimulation Intervention(s): Hand expression  Type of Nipple: Flat Intervention(s):  (shield)  Comfort (Breast/Nipple): Soft / non-tender     Hold (Positioning): Full assist, staff holds infant at breast Intervention(s): Support Pillows;Position options  LATCH Score: 6  Lactation Tools Discussed/Used Tools: Nipple Shields Nipple shield size: 24   Consult Status Consult Status: Follow-up Date: 12/29/16 Follow-up type: In-patient    Rulon Eisenmengerlizabeth E Suzanne Kho 12/28/2016, 9:54 PM

## 2016-12-28 NOTE — Progress Notes (Signed)
Subjective: Doing well, pain controled, UCs MVU at or above 240  Anesthesia epidural Induction GDMA2/Mild PIH   Objective: BP 123/82   Pulse (!) 53   Temp 98.4 F (36.9 C) (Oral)   Resp 18   Ht 5\' 4"  (1.626 m)   Wt 202 lb (91.6 kg)   LMP 03/26/2016   BMI 34.67 kg/m    FHT:  FHR: 130-140 bpm, variability: moderate,  accelerations:  Present,  decelerations:  Present Occasional early/mild variable decelerations when UCs too close UC:   regular, every 2-3 minutes VE:   Dilation: 8 Effacement (%): 90 Station: -1 Exam by:: Seymour BarsLavoie, MD   Assessment / Plan: Arrest of progression of Dilation in active labor.  No significant change in >4 hours while MVU very adequate, therefore FPD.  Fetal Wellbeing:  Category I Pain Control:  Epidural  Anticipated MOD:  Decision to proceed with Primary Urgent C/S.  Informed consent signed.Genia Del.  Marie-Lyne Dawsyn Zurn 12/28/2016, 6:28 PM

## 2016-12-28 NOTE — Transfer of Care (Signed)
Immediate Anesthesia Transfer of Care Note  Patient: Danella Maiersshley M Quant  Procedure(s) Performed: Procedure(s): CESAREAN SECTION (N/A)  Patient Location: PACU  Anesthesia Type:Epidural  Level of Consciousness: awake, alert  and oriented  Airway & Oxygen Therapy: Patient Spontanous Breathing  Post-op Assessment: Report given to RN and Post -op Vital signs reviewed and stable  Post vital signs: Reviewed and stable  Last Vitals:  Vitals:   12/28/16 1817 12/28/16 1830  BP: (!) 141/84 126/87  Pulse: (!) 56 62  Resp: 18 18  Temp:  36.8 C    Last Pain:  Vitals:   12/28/16 1830  TempSrc: Oral  PainSc: 0-No pain      Patients Stated Pain Goal: 3 (12/28/16 0005)  Complications: No apparent anesthesia complications

## 2016-12-29 ENCOUNTER — Inpatient Hospital Stay (HOSPITAL_COMMUNITY): Admission: RE | Admit: 2016-12-29 | Payer: BLUE CROSS/BLUE SHIELD | Source: Ambulatory Visit

## 2016-12-29 ENCOUNTER — Encounter (HOSPITAL_COMMUNITY): Payer: Self-pay | Admitting: Obstetrics & Gynecology

## 2016-12-29 LAB — CBC
HCT: 29.6 % — ABNORMAL LOW (ref 36.0–46.0)
Hemoglobin: 10.2 g/dL — ABNORMAL LOW (ref 12.0–15.0)
MCH: 30.6 pg (ref 26.0–34.0)
MCHC: 34.5 g/dL (ref 30.0–36.0)
MCV: 88.9 fL (ref 78.0–100.0)
PLATELETS: 220 10*3/uL (ref 150–400)
RBC: 3.33 MIL/uL — AB (ref 3.87–5.11)
RDW: 14.7 % (ref 11.5–15.5)
WBC: 10 10*3/uL (ref 4.0–10.5)

## 2016-12-29 LAB — RPR: RPR Ser Ql: NONREACTIVE

## 2016-12-29 MED ORDER — MAGNESIUM 200 MG PO TABS
200.0000 mg | ORAL_TABLET | Freq: Two times a day (BID) | ORAL | Status: DC
  Administered 2016-12-29 – 2016-12-31 (×5): 200 mg via ORAL
  Filled 2016-12-29 (×6): qty 1

## 2016-12-29 NOTE — Progress Notes (Signed)
MOB was referred for history of depression/anxiety.  Referral is screened out by Clinical Social Worker because none of the following criteria appear to apply and there are no reports impacting the pregnancy or her transition to the postpartum period. CSW does not deem it clinically necessary to further investigate at this time.  -History of anxiety/depression during this pregnancy, or of post-partum depression. - Diagnosis of anxiety and/or depression within last 3 years - History of depression due to pregnancy loss/loss of child or -MOB's symptoms are currently being treated with medication and/or therapy.  CSW met with MOB at bedside in attempt to complete assessment. At this time, MOB and FOB informed this Probation officer that MOB's dx of anxiety/depression was three+ years old and that she is not taksing any medication for it and never had to. MOB and FOB were pleasant; however, note they did not feel the need to discuss dx. CSW educated MOB and FOB on PPD and SIDS. Both verbalize understanding. Please contact the Clinical Social Worker if needs arise or upon MOB request.   Oda Cogan, MSW, [facility]  Office: [phone]

## 2016-12-29 NOTE — Lactation Note (Signed)
This note was copied from a baby's chart. Lactation Consultation Note  Patient Name: Amy Rodgers ZOXWR'UToday's Date: 12/29/2016 Reason for consult: Follow-up assessment Baby at 23 hr of life. Mom is latching baby "some" using the NS bilaterally. She is pumping and offering her expressed milk with a slow flow bottle. Parents have started offering formula bottles. Reviewed volume guidelines. Mom denies breast or nipple pain. Parents are aware of lactation services and support group should they choose to continue offering breast milk tomorrow.   Maternal Data    Feeding Feeding Type: Formula Nipple Type: Slow - flow  LATCH Score/Interventions                      Lactation Tools Discussed/Used     Consult Status Consult Status: Follow-up Date: 12/30/16 Follow-up type: In-patient    Amy Rodgers 12/29/2016, 7:05 PM

## 2016-12-29 NOTE — Anesthesia Postprocedure Evaluation (Signed)
Anesthesia Post Note  Patient: Amy Rodgers  Procedure(s) Performed: Procedure(s) (LRB): CESAREAN SECTION (N/A)  Patient location during evaluation: PACU Anesthesia Type: Epidural Level of consciousness: awake Pain management: pain level controlled Vital Signs Assessment: post-procedure vital signs reviewed and stable Respiratory status: spontaneous breathing Cardiovascular status: stable Postop Assessment: no headache, no backache, epidural receding, patient able to bend at knees and no signs of nausea or vomiting Anesthetic complications: no        Last Vitals:  Vitals:   12/29/16 0551 12/29/16 0943  BP: 93/60 (!) 86/46  Pulse: 79 66  Resp:  17  Temp:  36.4 C    Last Pain:  Vitals:   12/29/16 1044  TempSrc:   PainSc: 1    Pain Goal: Patients Stated Pain Goal: 1 (12/28/16 2220)               Keenan Trefry JR,JOHN Susann GivensFRANKLIN

## 2016-12-29 NOTE — Anesthesia Postprocedure Evaluation (Addendum)
Anesthesia Post Note  Patient: Amy Rodgers  Procedure(s) Performed: Procedure(s) (LRB): CESAREAN SECTION (N/A)  Patient location during evaluation: Mother Baby Anesthesia Type: Epidural Level of consciousness: awake and alert, oriented and patient cooperative Pain management: pain level controlled Vital Signs Assessment: post-procedure vital signs reviewed and stable Respiratory status: spontaneous breathing Cardiovascular status: stable Postop Assessment: no headache, epidural receding, patient able to bend at knees and no signs of nausea or vomiting Anesthetic complications: no Comments: Pain score 1.        Last Vitals:  Vitals:   12/29/16 0548 12/29/16 0551  BP: 98/66 93/60  Pulse: 70 79  Resp:    Temp:      Last Pain:  Vitals:   12/29/16 0540  TempSrc:   PainSc: 1    Pain Goal: Patients Stated Pain Goal: 1 (12/28/16 2220)               Merrilyn PumaWRINKLE,DANA

## 2016-12-29 NOTE — Progress Notes (Signed)
POSTOPERATIVE DAY # 1 S/P CS - arrest of active labor  S:         Reports feeling ok - only mildly sore             Tolerating po intake / no nausea / no vomiting / no flatus / no BM             Bleeding is light             Pain controlled long-acting narcotic and motrin     Newborn  breast feeding    O:  VS: BP 93/60 (BP Location: Left Arm)   Pulse 79   Temp 98.1 F (36.7 C) (Oral)   Resp 18   Ht 5\' 4"  (1.626 m)   Wt 91.6 kg (202 lb)   LMP 03/26/2016   SpO2 97%   BMI 34.67 kg/m    LABS:               Recent Labs  12/27/16 0348 12/29/16 0618  WBC 8.5 10.0  HGB 12.6 10.2*  PLT 241 220               Bloodtype: O/Positive/-- (07/19 0000)  Rubella: Immune (07/19 0000)              tdap and flu current                                         I&O: Intake/Output      02/10 0701 - 02/11 0700 02/11 0701 - 02/12 0700   I.V. (mL/kg) 2265 (24.7)    Other 360    Total Intake(mL/kg) 2625 (28.7)    Urine (mL/kg/hr) 2885 (1.3)    Blood 750 (0.3)    Total Output 3635     Net -1010                     Physical Exam:             Alert and Oriented X3  Lungs: Clear and unlabored  Heart: regular rate and rhythm / no mumurs  Abdomen: soft, non-tender, non-distended              Fundus: firm, non-tender, Ueven             Dressing intact              Incision:  approximated with suture / no erythema / no ecchymosis / no drainage  Perineum: intact  Lochia: light  Extremities: trace edema, no calf pain or tenderness, SCD in place  A:        POD # 1 S/P CS - arrest of active labor            Mild ABL anemia  P:        Routine postoperative care            Consider early DC tomorrow                Marlinda MikeBAILEY, TANYA CNM, MSN, [facility] Center[facility]ACNM 12/29/2016, 9:23 AM

## 2016-12-30 DIAGNOSIS — D62 Acute posthemorrhagic anemia: Secondary | ICD-10-CM | POA: Diagnosis not present

## 2016-12-30 LAB — GLUCOSE, CAPILLARY: GLUCOSE-CAPILLARY: 122 mg/dL — AB (ref 65–99)

## 2016-12-30 NOTE — Lactation Note (Signed)
This note was copied from a baby's chart. Lactation Consultation Note LC entered room and photographer was about to take pics.  Mom and dad stated they are BF and formula feeding.  Mom said baby would need to eat soon and asked LC to come back.  LC provided work phone number on dry erase board and asked parents to call when pics were finished.     Patient Name: Amy Rodgers'UToday's Date: 12/30/2016     Maternal Data    Feeding Feeding Type: Bottle Fed - Formula Nipple Type: Slow - flow  LATCH Score/Interventions                      Lactation Tools Discussed/Used     Consult Status      Maryruth HancockKelly Suzanne [facility] 12/30/2016, 2:50 PM

## 2016-12-30 NOTE — Plan of Care (Signed)
Problem: Bowel/Gastric: Goal: Will not experience complications related to bowel motility Outcome: Completed/Met Date Met: 12/30/16 Encouraged increased po fluid intake and ambulating in hallway.

## 2016-12-30 NOTE — Progress Notes (Signed)
Subjective: POD# 2 Information for the patient's newborn:  Amy Rodgers, Amy Rodgers [[id number]][[id number]]  female  Baby name: Amy Rodgers  Reports feeling well, more sore today, some burning at incision site Feeding: breast and bottle  Patient reports tolerating PO.  Breast symptoms:cracked nipple on R with some bleeding  Pain controlled withibuprofen (OTC) and percocet  Denies HA/SOB/C/P/N/V/dizziness. Flatus present +BM. She reports vaginal bleeding as normal, without clots.  She is ambulating, urinating without difficulty.     Objective:   VS:    Vitals:   12/29/16 0943 12/29/16 1709 12/29/16 1823 12/30/16 0631  BP: (!) 86/46 (!) 85/51 99/66 111/66  Pulse: 66 (!) 59 62 67  Resp: 17 20 16 16   Temp: 97.6 F (36.4 C) 97.4 F (36.3 C) 98.4 F (36.9 C) 97.9 F (36.6 C)  TempSrc: Oral Oral Oral Oral  SpO2:  98%    Weight:      Height:         Intake/Output Summary (Last 24 hours) at 12/30/16 [id number] Last data filed at 12/29/16 1806  Gross per 24 hour  Intake                0 ml  Output              200 ml  Net             -200 ml        Recent Labs  12/29/16 0618  WBC 10.0  HGB 10.2*  HCT 29.6*  PLT 220     Blood type: O/Positive/-- (07/19 0000)  Rubella: Immune (07/19 0000)     Physical Exam:  General: alert, cooperative and no distress Abdomen: soft, appropriate tenderness, normal bowel sounds Incision: clean, dry, intact and midline lower edge eccymosis  Uterine Fundus: firm, below umbilicus, nontender Lochia: minimal Ext: trace pedal edema, symmetric, no calf tenderness       Assessment/Plan: 31 y.o.   POD# 2. G1P0                  Principal Problem:   Postpartum care following cesarean delivery (2/10) Active Problems:   GDM, class A2 off meds    Cesarean delivery delivered - arrest of active labor   Acute blood loss anemia  Asymptomatic   Doing well, stable.               Advance diet as tolerated Encourage rest when baby rests Breastfeeding support,  education regarding cluster feeding, normal feeding pattern of newborn, nipple shield R side  Encourage to ambulate in halls  Routine post-op care Anticipate DC tomorrow AM   Amy Rodgers, CNM, MSN 12/30/2016, 9:53 AM

## 2016-12-31 MED ORDER — IBUPROFEN 600 MG PO TABS: 600.0000 mg | ORAL_TABLET | Freq: Four times a day (QID) | ORAL | 0 refills | Status: DC

## 2016-12-31 MED ORDER — OXYCODONE HCL 5 MG PO TABS: 5.0000 mg | ORAL_TABLET | ORAL | 0 refills | Status: DC | PRN

## 2016-12-31 NOTE — Discharge Summary (Signed)
OB Discharge Summary  Patient Name: Amy Rodgers DOB: 1985-11-20 MRN: [id number]  Date of admission: 12/27/2016  Admitting diagnosis: 7537w3d, Contractions  Intrauterine pregnancy: 7328w0d        Date of discharge: 12/31/2016    Discharge diagnosis: Term Pregnancy Delivered and POD 3 s/p Cesarean section - arrest of labor      Prenatal history: G1P0   EDC : 12/31/2016, by Last Menstrual Period  [facility]  Primary provider : Mody Prenatal course complicated by GDMa2  Prenatal Labs: ABO, Rh: O/Positive/-- (07/19 0000)  Antibody: Negative (07/19 0000) Rubella: Immune (07/19 0000)   RPR: Non Reactive (02/11 0618)  HBsAg: Negative (07/19 0000)  HIV: Non-reactive (07/19 0000)  GBS: Positive (01/09 0000)                                    Hospital course:  Onset of Labor With Unplanned C/S  31 y.o. yo G1P0 at 2828w0d was admitted in Latent Labor on 12/27/2016.  Membrane Rupture Time/Date: 10:17 AM ,12/28/2016   The patient went for cesarean section due to Arrest of Dilation at 8 cm and delivered a Viable infant,12/28/2016  Details of operation can be found in separate operative note. Patient had an uncomplicated postpartum course.  She is ambulating,tolerating a regular diet, passing flatus, and urinating well.  Patient is discharged home in stable condition 12/31/16. Augmentation: Pitocin Delivering PROVIDER: LAVOIE, MARIE-LYNE                                                            Complications: None  Newborn Data: Live born female  Birth Weight: 7 lb 1.4 oz (3215 g) APGAR: 9, 10  Baby Feeding: Bottle and Breast Disposition:home with mother  Post partum procedures:none  Postpartum contraception: Not Discussed    Labs: Lab Results  Component Value Date   WBC 10.0 12/29/2016   HGB 10.2 (L) 12/29/2016   HCT 29.6 (L) 12/29/2016   MCV 88.9 12/29/2016   PLT 220 12/29/2016   CMP Latest Ref Rng & Units 12/27/2016  Glucose 65 - 99  mg/dL 962(X106(H)  BUN 6 - 20 mg/dL 7  Creatinine 5.280.44 - 4.131.00 mg/dL 2.440.48  Sodium 010135 - 272145 mmol/L 134(L)  Potassium 3.5 - 5.1 mmol/L 4.3  Chloride 101 - 111 mmol/L 105  CO2 22 - 32 mmol/L 18(L)  Calcium 8.9 - 10.3 mg/dL 9.1  Total Protein 6.5 - 8.1 g/dL 7.1  Total Bilirubin 0.3 - 1.2 mg/dL 0.6  Alkaline Phos 38 - 126 U/L 103  AST 15 - 41 U/L 18  ALT 14 - 54 U/L 14    Physical Exam @ time of discharge:  Vitals:   12/29/16 1823 12/30/16 0631 12/30/16 1742 12/31/16 0620  BP: 99/66 111/66 111/69 113/72  Pulse: 62 67 70 82  Resp: 16 16 18 18   Temp: 98.4 F (36.9 C) 97.9 F (36.6 C) 97.3 F (36.3 C) 97.8 F (36.6 C)  TempSrc: Oral Oral Axillary   SpO2:      Weight:      Height:        General: alert, cooperative and no distress Lochia: appropriate Uterine Fundus: firm Perineum:  intact Incision: Healing well with no significant drainage Extremities: DVT Evaluation: No evidence of DVT seen on physical exam.   Discharge instructions:  "Baby and Me Booklet" and [facility] Booklet  Discharge Medications:  Allergies as of 12/31/2016      Reactions   Codeine Other (See Comments)   Headaches, itching      Medication List    STOP taking these medications   metFORMIN 500 MG tablet Commonly known as:  GLUCOPHAGE     TAKE these medications   ibuprofen 600 MG tablet Commonly known as:  ADVIL,MOTRIN Take 1 tablet (600 mg total) by mouth every 6 (six) hours.   oxyCODONE 5 MG immediate release tablet Commonly known as:  Oxy IR/ROXICODONE Take 1 tablet (5 mg total) by mouth every 4 (four) hours as needed (pain scale 4-7).   prenatal multivitamin Tabs tablet Take 1 tablet by mouth at bedtime.   promethazine 25 MG tablet Commonly known as:  PHENERGAN Take 1 tablet (25 mg total) by mouth every 6 (six) hours as needed.   ZANTAC PO Take 150 mg by mouth daily. Patient not sure of dose       Diet: carb modified diet  Activity: Advance as tolerated. Pelvic rest x 6 weeks.    Follow up:6 weeks    Signed: Marlinda Mike CNM, MSN, [facility] 12/31/2016, 10:29 AM

## 2016-12-31 NOTE — Progress Notes (Signed)
POSTOPERATIVE DAY # 3 S/P C/S   S:         Reports feeling well, better, still a little sore with some burning at incision when moving               Tolerating po intake / No nausea / No vomiting / + flatus /  BM yesterday              Bleeding is light             Pain controlled withibuprofen (OTC) and percocet             Up ad lib / ambulatory/ voiding QS  Newborn formula feeding with pumping    O:  VS: BP 113/72   Pulse 82   Temp 97.8 F (36.6 C)   Resp 18   Ht 5\' 4"  (1.626 m)   Wt 91.6 kg (202 lb)   LMP 03/26/2016   SpO2 98%   BMI 34.67 kg/m    LABS:               Recent Labs  12/29/16 0618  WBC 10.0  HGB 10.2*  PLT 220               Bloodtype: O/Positive/-- (07/19 0000)  Rubella: Immune (07/19 0000)                                             I&O: Intake/Output      02/12 0701 - 02/13 0700 02/13 0701 - 02/14 0700   Urine (mL/kg/hr)     Total Output       Net                         Physical Exam:             Alert and Oriented X3  Abdomen: soft, non-tender, non-distended              Fundus: firm, non-tender, @U              Dressing clean, dry, intact               Incision:  approximated with sutures / No erythema /lower midline ecchymosis / No drainage  Perineum: intact  Lochia: light  Extremities: No edema, no calf pain or tenderness, negative Homans  Breast: R cracked nipple healing   A:        POD # 3 S/P C/S  GDM, A2               P:        DC home this AM   WOB packet given   GTT 6-12 weeks PP    Steward DroneVeronica Aairah Negrette CNM, MSN, [facility]AC[facility] 12/31/2016, 10:06 AM

## 2017-01-02 ENCOUNTER — Encounter (HOSPITAL_COMMUNITY): Payer: Self-pay | Admitting: *Deleted

## 2017-01-03 NOTE — Addendum Note (Signed)
Addendum  created 01/03/17 [id number] by Achille RichAdam Jevaughn Degollado, MD   Anesthesia Staff edited

## 2017-07-09 NOTE — Addendum Note (Signed)
Addendum  created 07/09/17 1146 by River Ambrosio, MD   Sign clinical note    

## 2017-08-31 IMAGING — US US MFM OB DETAIL+14 WK
1 series · 14 of 28 positions shown · non-contrast
Comparison: none

[Series 1: us mfm ob detail+14 wk · 126 acquisitions, 14 frames shown]
[im 5/126]
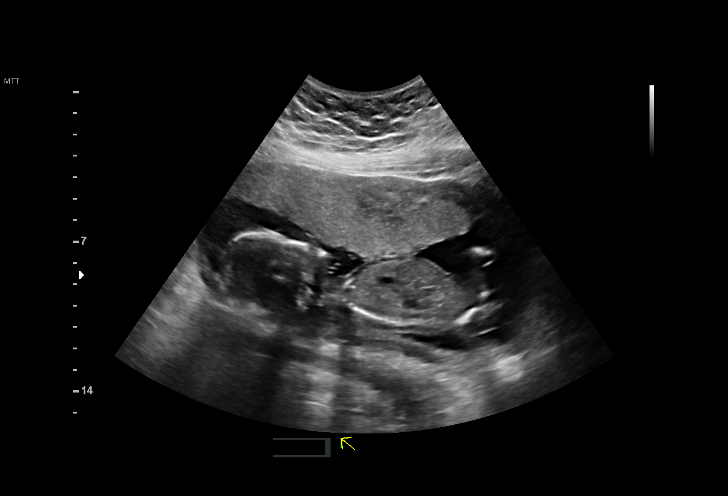
[im 14/126]
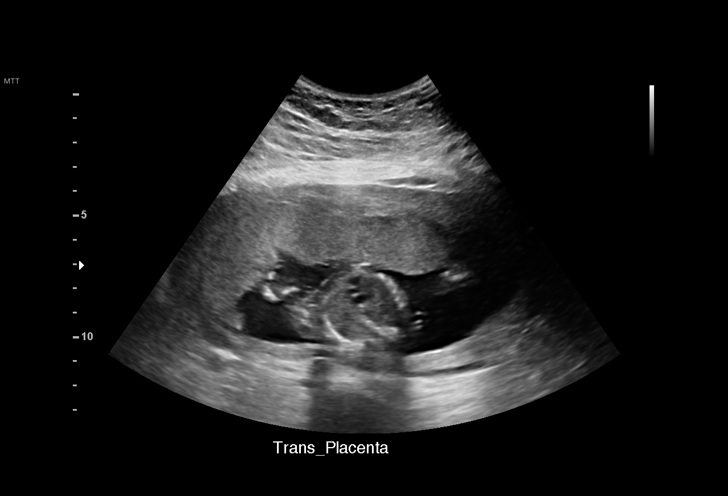
[im 24/126]
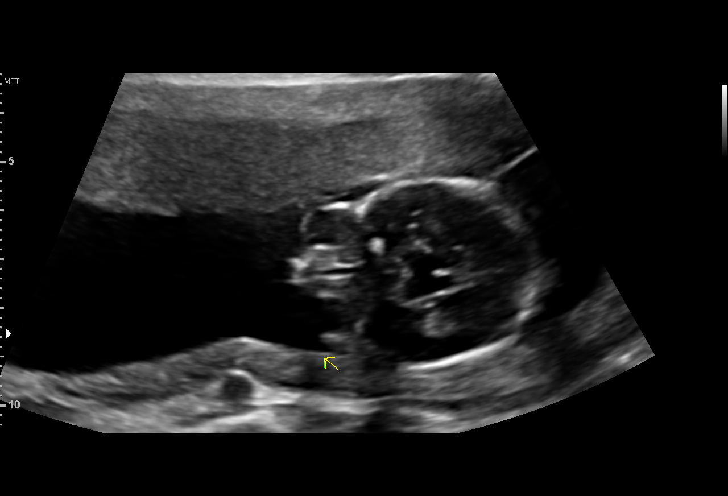
[im 33/126]
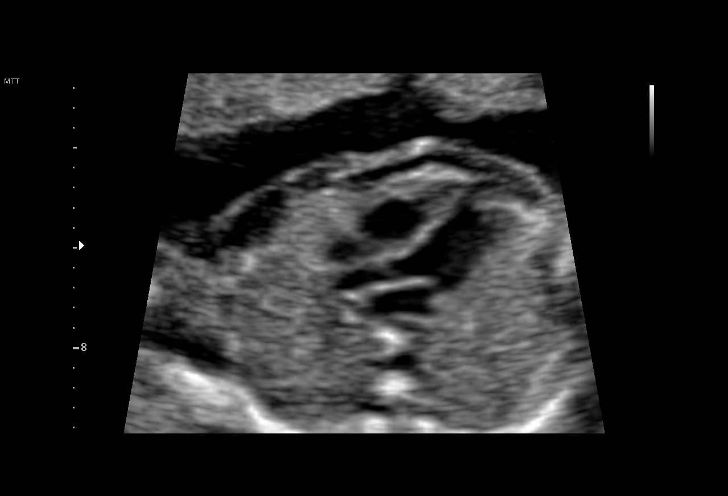
[im 42/126]
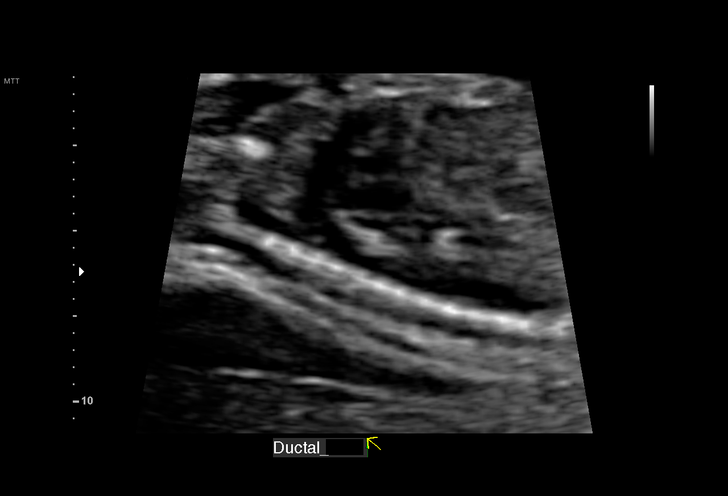
[im 51/126]
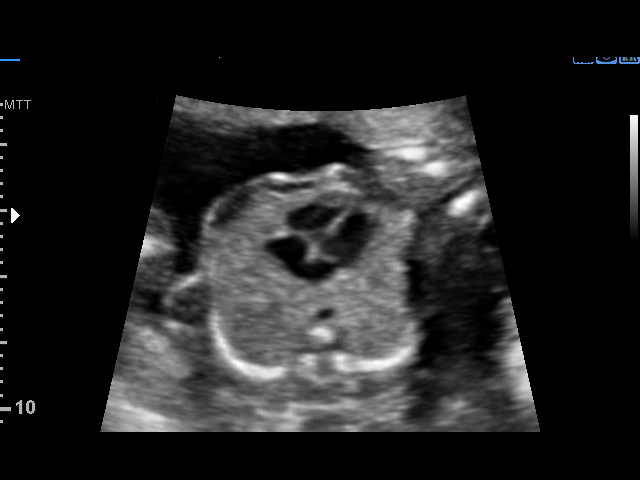
[im 61/126]
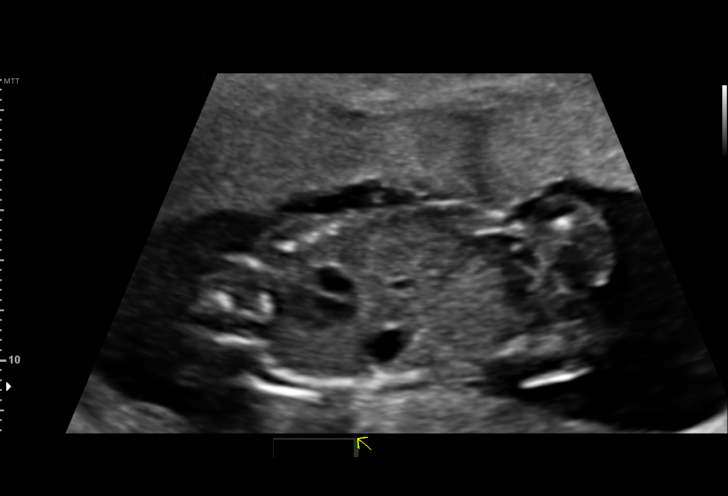
[im 70/126]
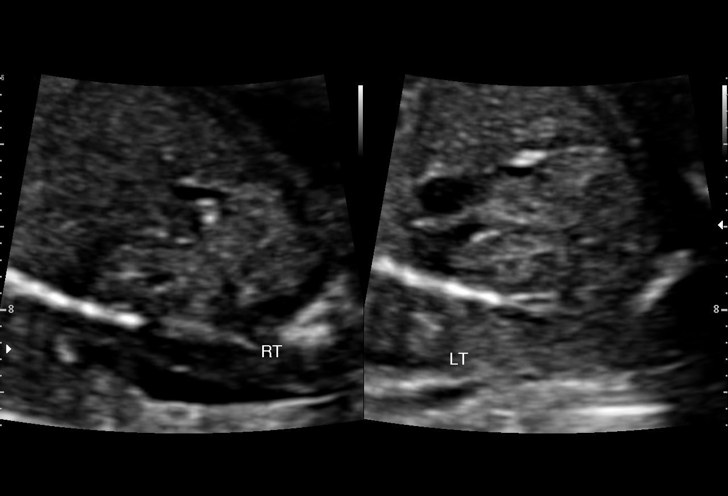
[im 79/126]
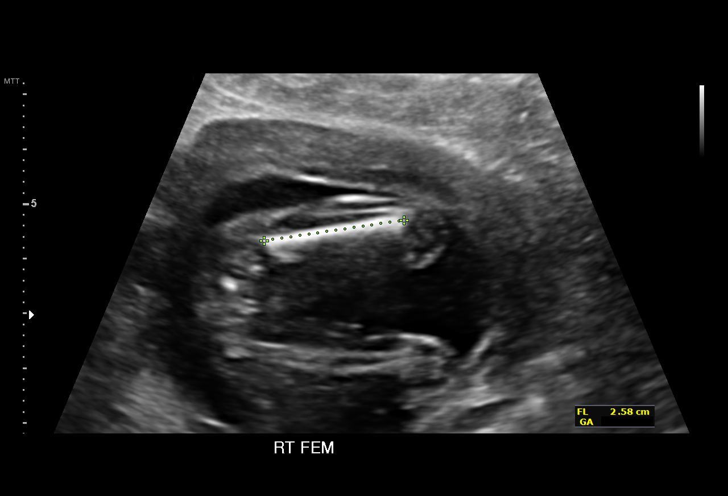
[im 88/126]
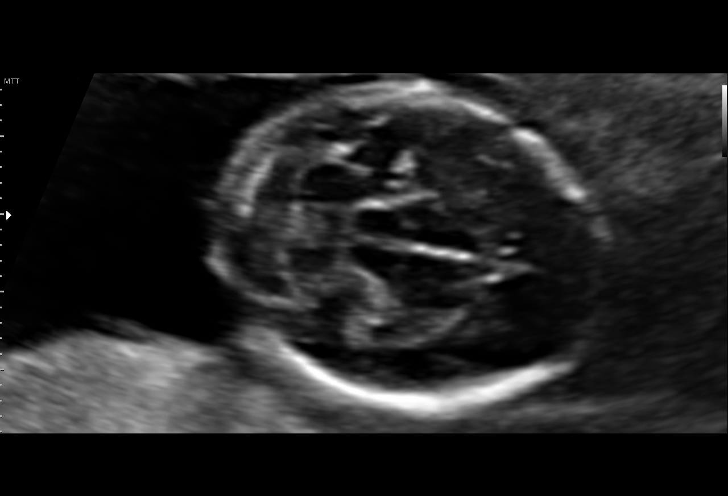
[im 98/126]
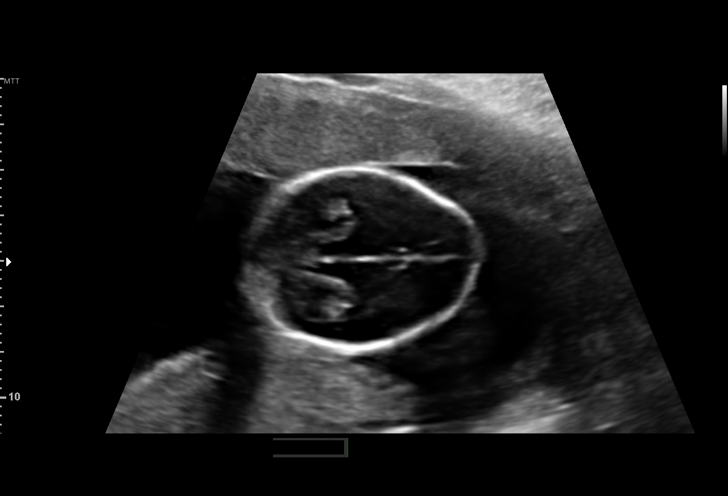
[im 107/126]
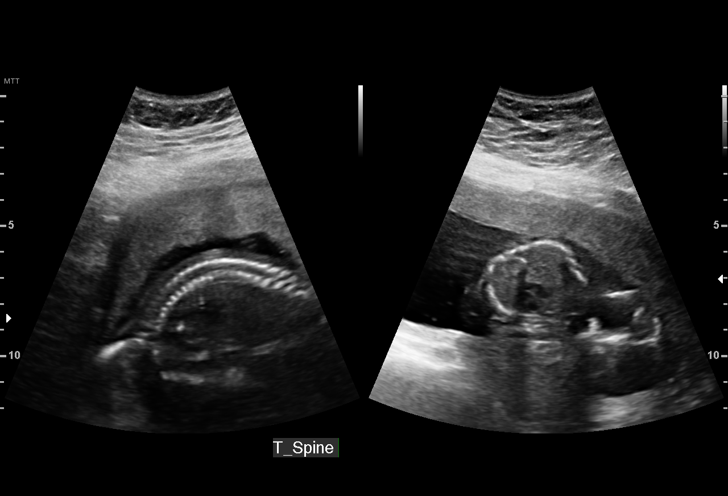
[im 116/126]
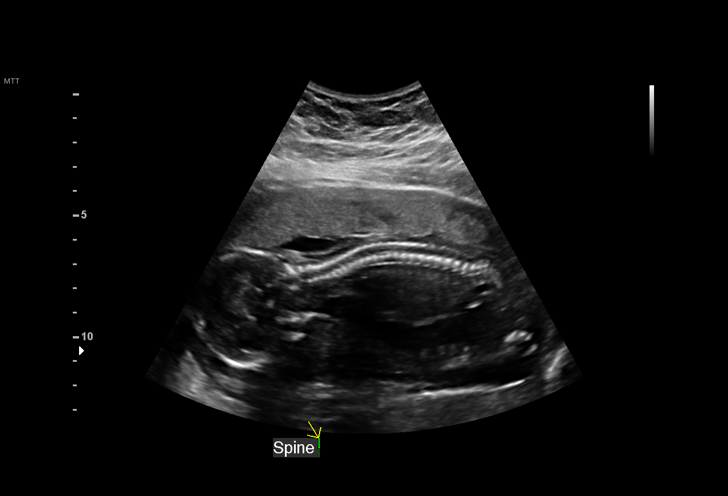
[im 126/126]
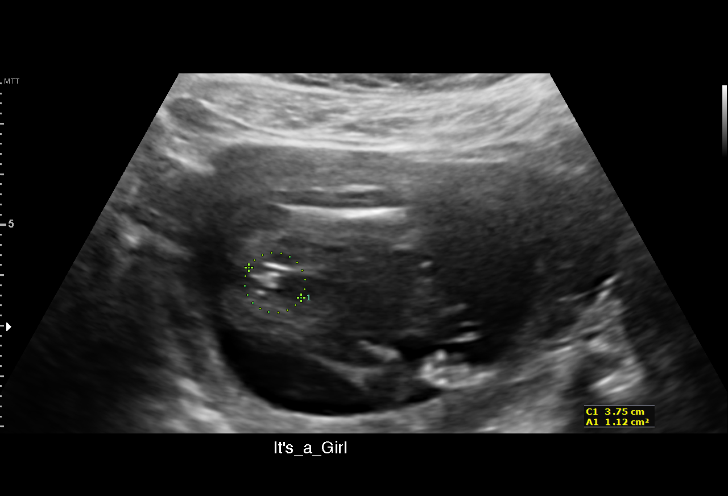

[14 of 28 positions shown; findings below may reference images not displayed]

[address]  [address] BEDZETI            [id number]      [id number]     [id number]
Indications

18 weeks gestation of pregnancy
Detailed fetal anatomic survey                 Z36
Abnormal biochemical screen (quad) for
Trisomy 21; low risk NIPS
PCOS, being treated as GDM with pregnancy
OB History

Blood Type:            Height:  5'5"   Weight (lb):  197      BMI:
Gravidity:    1         Term:   0        Prem:   0        SAB:   0
TOP:          0       Ectopic:  0        Living: 0
Fetal Evaluation

Num Of Fetuses:     1
Fetal Heart         140
Rate(bpm):
Cardiac Activity:   Observed
Presentation:       Variable
Placenta:           Anterior, above cervical os
P. Cord Insertion:  Visualized, central

Amniotic Fluid
AFI FV:      Subjectively within normal limits

Largest Pocket(cm)
4.7
Biometry

BPD:      39.3  mm     G. Age:  18w 0d         48  %    CI:        75.25   %   70 - 86
FL/HC:      18.5   %   15.8 - 18
HC:      143.7  mm     G. Age:  17w 4d         23  %    HC/AC:      1.14       1.07 -
AC:      126.3  mm     G. Age:  18w 2d         55  %    FL/BPD:     67.7   %
FL:       26.6  mm     G. Age:  18w 1d         49  %    FL/AC:      21.1   %   20 - 24
HUM:      25.6  mm     G. Age:  18w 0d         57  %
CER:      18.3  mm     G. Age:  18w 1d         55  %
NFT:       3.4  mm

CM:        3.4  mm
Est. FW:     224  gm      0 lb 8 oz     51  %
Gestational Age

LMP:           18w 0d       Date:   03/26/16                 EDD:   12/31/16
U/S Today:     18w 0d                                        EDD:   12/31/16
Best:          18w 0d    Det. By:   LMP  (03/26/16)          EDD:   12/31/16
Anatomy

Cranium:               Appears normal         Aortic Arch:            Appears normal
Cavum:                 Appears normal         Ductal Arch:            Appears normal
Ventricles:            Appears normal         Diaphragm:              Appears normal
Choroid Plexus:        Heterogeneous          Stomach:                Appears normal, left
sided
Cerebellum:            Appears normal         Abdomen:                Appears normal
Posterior Fossa:       Appears normal         Abdominal Wall:         Appears nml (cord
insert, abd wall)
Nuchal Fold:           Appears normal         Cord Vessels:           Appears normal (3
vessel cord)
Face:                  Appears normal         Kidneys:                Appear normal
(orbits and profile)
Lips:                  Appears normal         Bladder:                Appears normal
Thoracic:              Appears normal         Spine:                  Appears normal
Heart:                 Appears normal         Upper Extremities:      Appears normal
(4CH, axis, and situs
RVOT:                  Appears normal         Lower Extremities:      Appears normal
LVOT:                  Appears normal

Other:  Fetus appears to be a female. Heels visualized. Nasal bone
visualized. Technically difficult due to maternal habitus.
Cervix Uterus Adnexa

Cervix
Length:           4.16  cm.
Normal appearance by transabdominal scan.

Uterus
No abnormality visualized.

Left Ovary
Size(cm)     3.45  x    2.48   x  2.34      Vol(ml):
Within normal limits. No adnexal mass visualized.

Right Ovary
Size(cm)     3.34  x    3.42   x  2.49      Vol(ml):
Within normal limits. No adnexal mass visualized.

Cul De Sac:   No free fluid seen.

Adnexa:       No abnormality visualized.
Impression

SIUP at 18+0 weeks
Normal detailed fetal anatomy
Markers of aneuploidy: none
Normal amniotic fluid volume
Measurements consistent with LMP dating
Recommendations

Please see genetic counseling note
Follow-up as clinically indicated

## 2018-05-25 ENCOUNTER — Encounter: Payer: BLUE CROSS/BLUE SHIELD | Attending: Obstetrics & Gynecology | Admitting: Registered"

## 2018-05-25 DIAGNOSIS — Z713 Dietary counseling and surveillance: Secondary | ICD-10-CM | POA: Insufficient documentation

## 2018-05-25 DIAGNOSIS — E282 Polycystic ovarian syndrome: Secondary | ICD-10-CM | POA: Insufficient documentation

## 2018-05-25 NOTE — Progress Notes (Signed)
Medical Nutrition Therapy:  Appt start time: 1410 end time:  1510.   Assessment:  Primary concerns today: Pt states she knows food can affect PCOS. Pt states she was following a diabetic diet of 45 g or less carbs per meal and 10-15 g per snack (handfull of almonds/cheese) and staying away from some fruits such as strawberries.  Pt states she sometimes will not eat much during the day and then eat too much in evening to the point of feeling sick. Pt knows that when she is better at eating meals earlier during the day this nighttime overeating doesn't happen. Pt states this pattern has not changed since starting the phentermine.   Pt states she has history of yo-yo dieting and has negative experience with parental and peer negative comments about her body. Pt states a lot of her unhealthy eating patterns are related to stress eating. RD will investigate further at next appointment and may recommend patient pursue further evaluation for disordered eating and appropriate avenues for therapy.  Pt states she makes meals and snacks for her 5817 mo old daughter who has milk protein allergy and reflux. Pt states she has to avoids all dairy products including butter, peppery spices and tomato based meals.    Patient states she has a hard time focusing on her needs because of guilt that it is taking time away from her daughter. Pt states she knows she needs to make an appointment with a counselor to help get over this hurdle but doesn't want to have another appointment take up the time.  Patient confirms taking Phentermine 37.5 mg & Metformin x1 month.  Fertility: Pt states she had tried for 9 years to get pregnant. RD believes she said that after she first started the metformin to regular periods and address insulin resistance is when she unexpectedly conceived.   PMH per patient includes Reflux, Hx GDM, Iron was borderline low after baby was born, constipation since pregnancy, Pt states Vit D low  taking  4,000 units daily [facility]([place] lab vit D 25 0H: 26)  Sleep: 6 hrs, occasionally have a hard time falling asleep, sometimes restless due to daughter's restlessness heard through monitor.  Preferred Learning Style:   No preference indicated   Learning Readiness:   Contemplating  MEDICATIONS: reviewed   DIETARY INTAKE:  Avoided foods include dairy and foods that contribute to reflux (spicy, tomato)  24-hr recall:  B ( AM): 1 scrambled egg  Snk ( AM): clementine  L ( PM): none OR hot dog, 1/2 whole grain bun Snk ( PM): none D ( PM): chicken, brown rice, peas, 1/4 c ice cream Snk ( PM): none Beverages: water  Usual physical activity: 3-5x/week 60 min walking [place], 1x week zumba  Estimated energy needs: 1800 calories  Progress Towards Goal(s):  New goal.   Nutritional Diagnosis:  NI-1.4 Inadequate energy intake As related to estimated less than 1,000 kcal/day.  As evidenced by dietary recall.    Intervention:  Nutrition Education. Discussed importance of getting enough nutrition to support health and energy. To help expand dietary variety and ease of preparing meals, discussed potential of A2 milk, and recommended she research and ask her pediatrician if interested in trying. Discussed common deficiencies seen with PCOS and supplementation that can help normalize hormonal function. Discussed options for iron that may reduce side effect of constipation.  Plan: Continue to take your vitamin D May want to consider the Vitanica Iron supplement  Consider inositol for PCOS  Aim to get 3 balanced meals per day  Teaching Method Utilized:  Visual Auditory  Handouts given during visit include:  What is Inositol  Barriers to learning/adherence to lifestyle change: none  Demonstrated degree of understanding via:  Teach Back   Monitoring/Evaluation:  Dietary intake, exercise, and body weight in 2 week(s).

## 2018-05-25 NOTE — Patient Instructions (Signed)
Continue to take your vitamin D May want to consider the Vitanica Iron supplement  Consider inositol for PCOS Aim to get 3 balanced meals per day

## 2018-05-26 ENCOUNTER — Encounter: Payer: Self-pay | Admitting: Registered"

## 2018-06-08 ENCOUNTER — Encounter: Payer: BLUE CROSS/BLUE SHIELD | Admitting: Registered"

## 2018-06-08 DIAGNOSIS — Z713 Dietary counseling and surveillance: Secondary | ICD-10-CM

## 2018-06-08 NOTE — Patient Instructions (Addendum)
Continue to have regular meals especially getting in breakfast. Amy Rodgers is a quick cook idea, a side veggie in the microwave Protein shakes or Carnation instant breakfast are fine. Continue to eat more vegetables, celery with PB is a great snack. Look for balance when evaluating foods products

## 2018-06-08 NOTE — Progress Notes (Signed)
Medical Nutrition Therapy:  Appt start time: 1535 end time:  1605.  Assessment:  Primary concerns today: Pt states when her period is late she gets nauseated and sprite helps to settle stomach. Pt states she hasn't started taking inositol yet because she is in the process of changing her birth control and doesn't want to get pregnant with inositol's potential for improving fertility.  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  Pt states the magnesium taken nightly has helped constipation. Pt reports for the last 2 weeks she has been eating more structured meals and less stress eating.  Pt states she wants ideas for easy meals because she cooks dairy free for her daughter and sometimes if she doesn't like what she prepares for daughter will will just skip a meal.  Patient states she is still taking Phentermine but doesn't remember to take it every day.   Patient is going to wait until she gets started on the inositol before making another follow-up appointment.   Sample provided: Carnation Breakfast essentials Lot#: [id number] Exp: Jan 15, 2019  Preferred Learning Style:   No preference indicated   Learning Readiness:   Contemplating  MEDICATIONS: reviewed   DIETARY INTAKE:  Avoided foods include dairy and foods that contribute to reflux (spicy, tomato)  24-hr recall:  B ( AM): 2 scrambled eggs  Snk ( AM): none L ( PM): [place] cheese steak, no bread, fruit bowl Snk ( PM): none D ( PM): cauliflower pizza  Snk ( PM): 1/2 c ice cream Beverages: water, sprite to settle stomach  Usual physical activity: (same) 3-5x/week 60 min walking [place] in [place] neighborhoods, 1x week zumba  Estimated energy needs: 1800 calories  Progress Towards Goal(s):  New goal.   Nutritional Diagnosis:  NI-1.4 Inadequate energy intake As related to estimated less than 1,000 kcal/day.  As  evidenced by dietary recall.    Intervention:  Nutrition Education. Discussed importance of continuing to eat regular meals to help with energy as well as reduce potential of stress eating. Discussed options for quick ideas when tempted to skip a meal.  Plan: Continue to have regular meals especially getting in breakfast. Carmie KannerSalmon is a quick cook idea, a side veggie in the microwave Protein shakes or Carnation instant breakfast are fine. Continue to eat more vegetables, celery with PB is a great snack. Look for balance when evaluating foods products   Teaching Method Utilized:  Visual Auditory  Handouts given during visit include:  none  Barriers to learning/adherence to lifestyle change: none  Demonstrated degree of understanding via:  Teach Back   Monitoring/Evaluation:  Dietary intake, exercise, and body weight prn.

## 2018-11-18 ENCOUNTER — Encounter (HOSPITAL_COMMUNITY): Payer: Self-pay | Admitting: *Deleted

## 2018-11-18 ENCOUNTER — Inpatient Hospital Stay (HOSPITAL_COMMUNITY)
Admission: AD | Admit: 2018-11-18 | Discharge: 2018-11-18 | Disposition: A | Discharge status: Home / Self Care | Payer: BLUE CROSS/BLUE SHIELD | Attending: Obstetrics and Gynecology | Admitting: Obstetrics and Gynecology

## 2018-11-18 DIAGNOSIS — Z3A08 8 weeks gestation of pregnancy: Secondary | ICD-10-CM | POA: Diagnosis not present

## 2018-11-18 DIAGNOSIS — O219 Vomiting of pregnancy, unspecified: Secondary | ICD-10-CM

## 2018-11-18 DIAGNOSIS — Z3A14 14 weeks gestation of pregnancy: Secondary | ICD-10-CM

## 2018-11-18 DIAGNOSIS — Z3A01 Less than 8 weeks gestation of pregnancy: Secondary | ICD-10-CM

## 2018-11-18 DIAGNOSIS — O210 Mild hyperemesis gravidarum: Secondary | ICD-10-CM | POA: Insufficient documentation

## 2018-11-18 LAB — URINALYSIS, ROUTINE W REFLEX MICROSCOPIC
Bacteria, UA: NONE SEEN
Bilirubin Urine: NEGATIVE
GLUCOSE, UA: NEGATIVE mg/dL
Hgb urine dipstick: NEGATIVE
Ketones, ur: NEGATIVE mg/dL
Nitrite: NEGATIVE
PROTEIN: NEGATIVE mg/dL
Specific Gravity, Urine: 1.028 (ref 1.005–1.030)
pH: 5 (ref 5.0–8.0)

## 2018-11-18 LAB — CBC
HCT: 38.1 % (ref 36.0–46.0)
Hemoglobin: 12.6 g/dL (ref 12.0–15.0)
MCH: 30.1 pg (ref 26.0–34.0)
MCHC: 33.1 g/dL (ref 30.0–36.0)
MCV: 90.9 fL (ref 80.0–100.0)
Platelets: 262 10*3/uL (ref 150–400)
RBC: 4.19 MIL/uL (ref 3.87–5.11)
RDW: 14.8 % (ref 11.5–15.5)
WBC: 6.2 10*3/uL (ref 4.0–10.5)
nRBC: 0 % (ref 0.0–0.2)

## 2018-11-18 LAB — COMPREHENSIVE METABOLIC PANEL
ALT: 17 U/L (ref 0–44)
AST: 19 U/L (ref 15–41)
Albumin: 4.4 g/dL (ref 3.5–5.0)
Alkaline Phosphatase: 47 U/L (ref 38–126)
Anion gap: 8 (ref 5–15)
BUN: 8 mg/dL (ref 6–20)
CHLORIDE: 105 mmol/L (ref 98–111)
CO2: 23 mmol/L (ref 22–32)
Calcium: 9.8 mg/dL (ref 8.9–10.3)
Creatinine, Ser: 0.58 mg/dL (ref 0.44–1.00)
GFR calc Af Amer: >60 mL/min (ref >60)
Glucose, Bld: 103 mg/dL — ABNORMAL HIGH (ref 70–99)
Potassium: 3.7 mmol/L (ref 3.5–5.1)
Sodium: 136 mmol/L (ref 135–145)
Total Bilirubin: 0.9 mg/dL (ref 0.3–1.2)
Total Protein: 8.6 g/dL — ABNORMAL HIGH (ref 6.5–8.1)

## 2018-11-18 LAB — POCT PREGNANCY, URINE: Preg Test, Ur: POSITIVE — AB

## 2018-11-18 MED ORDER — LACTATED RINGERS IV BOLUS
1000.0000 mL | Freq: Once | INTRAVENOUS | Status: AC
  Administered 2018-11-18: 1000 mL via INTRAVENOUS

## 2018-11-18 MED ORDER — PROMETHAZINE HCL 25 MG/ML IJ SOLN
25.0000 mg | Freq: Once | INTRAMUSCULAR | Status: AC
  Administered 2018-11-18: 25 mg via INTRAVENOUS
  Filled 2018-11-18: qty 1

## 2018-11-18 MED ORDER — PROMETHAZINE HCL 25 MG PO TABS: 25.0000 mg | ORAL_TABLET | Freq: Four times a day (QID) | ORAL | 1 refills | Status: DC | PRN

## 2018-11-18 NOTE — Discharge Instructions (Signed)
Morning Sickness  Morning sickness is when a woman feels nauseous during pregnancy. This nauseous feeling may or may not come with vomiting. It often occurs in the morning, but it can be a problem at any time of day. Morning sickness is most common during the first trimester. In some cases, it may continue throughout pregnancy. Although morning sickness is unpleasant, it is usually harmless unless the woman develops severe and continual vomiting (hyperemesis gravidarum), a condition that requires more intense treatment. What are the causes? The exact cause of this condition is not known, but it seems to be related to normal hormonal changes that occur in pregnancy. What increases the risk? You are more likely to develop this condition if:  You experienced nausea or vomiting before your pregnancy.  You had morning sickness during a previous pregnancy.  You are pregnant with more than one baby, such as twins. What are the signs or symptoms? Symptoms of this condition include:  Nausea.  Vomiting. How is this diagnosed? This condition is usually diagnosed based on your signs and symptoms. How is this treated? In many cases, treatment is not needed for this condition. Making some changes to what you eat may help to control symptoms. Your health care provider may also prescribe or recommend:  Vitamin B6 supplements.  Anti-nausea medicines.  Ginger. Follow these instructions at home: Medicines  Take over-the-counter and prescription medicines only as told by your health care provider. Do not use any prescription, over-the-counter, or herbal medicines for morning sickness without first talking with your health care provider.  Taking multivitamins before getting pregnant can prevent or decrease the severity of morning sickness in most women. Eating and drinking  Eat a piece of dry toast or crackers before getting out of bed in the morning.  Eat 5 or 6 small meals a day.  Eat dry and  bland foods, such as rice or a baked potato. Foods that are high in carbohydrates are often helpful.  Avoid greasy, fatty, and spicy foods.  Have someone cook for you if the smell of any food causes nausea and vomiting.  If you feel nauseous after taking prenatal vitamins, take the vitamins at night or with a snack.  Snack on protein foods between meals if you are hungry. Nuts, yogurt, and cheese are good options.  Drink fluids throughout the day.  Try ginger ale made with real ginger, ginger tea made from fresh grated ginger, or ginger candies. General instructions  Do not use any products that contain nicotine or tobacco, such as cigarettes and e-cigarettes. If you need help quitting, ask your health care provider.  Get an air purifier to keep the air in your house free of odors.  Get plenty of fresh air.  Try to avoid odors that trigger your nausea.  Consider trying these methods to help relieve symptoms: ? Wearing an acupressure wristband. These wristbands are often worn for seasickness. ? Acupuncture. Contact a health care provider if:  Your home remedies are not working and you need medicine.  You feel dizzy or light-headed.  You are losing weight. Get help right away if:  You have persistent and uncontrolled nausea and vomiting.  You faint.  You have severe pain in your abdomen. Summary  Morning sickness is when a woman feels nauseous during pregnancy. This nauseous feeling may or may not come with vomiting.  Morning sickness is most common during the first trimester.  It often occurs in the morning, but it can be a problem at  any time of day. °· In many cases, treatment is not needed for this condition. Making some changes to what you eat may help to control symptoms. °This information is not intended to replace advice given to you by your health care provider. Make sure you discuss any questions you have with your health care provider. °Document Released:  12/26/2006 Document Revised: 12/07/2016 Document Reviewed: 12/07/2016 °Elsevier Interactive Patient Education © 2019 [employer] ° °                 Safe Medications in Pregnancy  ° ° °Acne: °Benzoyl Peroxide °Salicylic Acid ° °Backache/Headache: °Tylenol: 2 regular strength every 4 hours OR °             2 Extra strength every 6 hours ° °Colds/Coughs/Allergies: °Benadryl (alcohol free) 25 mg every 6 hours as needed °Breath right strips °Claritin °Cepacol throat lozenges °Chloraseptic throat spray °Cold-Eeze- up to three times per day °Cough drops, alcohol free °Flonase (by prescription only) °Guaifenesin °Mucinex °Robitussin DM (plain only, alcohol free) °Saline nasal spray/drops °Sudafed (pseudoephedrine) & Actifed ** use only after [redacted] weeks gestation and if you do not have high blood pressure °Tylenol °Vicks Vaporub °Zinc lozenges °Zyrtec  ° °Constipation: °Colace °Ducolax suppositories °Fleet enema °Glycerin suppositories °Metamucil °Milk of magnesia °Miralax °Senokot °Smooth move tea ° °Diarrhea: °Kaopectate °Imodium A-D ° °*NO pepto Bismol ° °Hemorrhoids: °Anusol °Anusol HC °Preparation H °Tucks ° °Indigestion: °Tums °Maalox °Mylanta °Zantac  °Pepcid ° °Insomnia: °Benadryl (alcohol free) 25mg every 6 hours as needed °Tylenol PM °Unisom, no Gelcaps ° °Leg Cramps: °Tums °MagGel ° °Nausea/Vomiting:  °Bonine °Dramamine °Emetrol °Ginger extract °Sea bands °Meclizine  °Nausea medication to take during pregnancy:  °Unisom (doxylamine succinate 25 mg tablets) Take one tablet daily at bedtime. If symptoms are not adequately controlled, the dose can be increased to a maximum recommended dose of two tablets daily (1/2 tablet in the morning, 1/2 tablet mid-afternoon and one at bedtime). °Vitamin B6 100mg tablets. Take one tablet twice a day (up to 200 mg per day). ° °Skin Rashes: °Aveeno products °Benadryl cream or 25mg every 6 hours as needed °Calamine Lotion °1% cortisone cream ° °Yeast infection: °Gyne-lotrimin  7 °Monistat 7 ° ° °**If taking multiple medications, please check labels to avoid duplicating the same active ingredients °**take medication as directed on the label °** Do not exceed 4000 mg of tylenol in 24 hours °**Do not take medications that contain aspirin or ibuprofen °

## 2018-11-18 NOTE — MAU Note (Signed)
Patient states she has had nausea and vomiting for past 4 days, barely able to keep anything down.  Had 1 episode of watery diarrhea today, but also had a normal BM today.  Denies fever.  Reports some mid abdominal pain from throwing up.  Passed out yesterday "when I vasovagaled using the bathroom."  Sat down on the ground prior to passing out, did not fall.

## 2018-11-18 NOTE — MAU Provider Note (Signed)
History     CSN: [id number]  Arrival date and time: 11/18/18 1421   First Provider Initiated Contact with Patient 11/18/18 1455      Chief Complaint  Patient presents with  . Abdominal Pain  . Nausea  . Emesis   HPI Amy Rodgers is a 33 y.o. G2P1001 at 7658w4d who presents with nausea and vomiting for 4 days. She states she has had nausea during the pregnancy but has been able to control it with peppermint candy until now. She was prescribed diclegis yesterday but it is not helping. She reports constant nausea and vomiting multiple times a day. She is unable to keep down any food or drink. She reports upper abdominal pain earlier today but no pain now. Denies vaginal bleeding or leaking of fluid. She had a 6 week ultrasound to confirm her pregnancy in the office.  OB History    Gravida  2   Para  1   Term  1   Preterm      AB      Living  1     SAB      TAB      Ectopic      Multiple  0   Live Births  1           Past Medical History:  Diagnosis Date  . Anemia    Borderline  . Anxiety   . Asthma   . Complication of anesthesia    "freaks out waking up from surgery"  . Depression   . GERD (gastroesophageal reflux disease)   . Gestational diabetes   . IBS (irritable bowel syndrome)   . Kidney stones   . Mononucleosis   . Pneumonia    10/16  . PONV (postoperative nausea and vomiting)   . Pre-diabetes     Past Surgical History:  Procedure Laterality Date  . BACK SURGERY    . CESAREAN SECTION N/A 12/28/2016   Procedure: CESAREAN SECTION;  Surgeon: Genia DelMarie-Lyne Lavoie, MD;  [facility];  Service: Obstetrics;  Laterality: N/A;  . CHROMOPERTUBATION Bilateral 10/26/2015   Procedure: CHROMOPERTUBATION;  Surgeon: Shea EvansVaishali Mody, MD;  Location: [facility];  Service: Gynecology;  Laterality: Bilateral;  . COLONOSCOPY    . Kidney stent    . KNEE SURGERY Left   . LAPAROSCOPY N/A 10/26/2015   Procedure: LAPAROSCOPY DIAGNOSTIC ;  Surgeon: Shea EvansVaishali  Mody, MD;  Location: [facility];  Service: Gynecology;  Laterality: N/A;  . TONSILLECTOMY    . UPPER GASTROINTESTINAL ENDOSCOPY      Family History  Problem Relation Age of Onset  . Hypertension Mother   . Hypertension Father   . Heart disease Father   . Cancer Maternal Aunt   . Cancer Maternal Grandmother     Social History   Tobacco Use  . Smoking status: Never Smoker  . Smokeless tobacco: Never Used  Substance Use Topics  . Alcohol use: No  . Drug use: No    Allergies:  Allergies  Allergen Reactions  . Codeine Other (See Comments)    Headaches, itching    Medications Prior to Admission  Medication Sig Dispense Refill Last Dose  . ibuprofen (ADVIL,MOTRIN) 600 MG tablet Take 1 tablet (600 mg total) by mouth every 6 (six) hours. 30 tablet 0   . oxyCODONE (OXY IR/ROXICODONE) 5 MG immediate release tablet Take 1 tablet (5 mg total) by mouth every 4 (four) hours as needed (pain scale 4-7). 20 tablet 0   . phentermine  37.5 MG capsule Take 37.5 mg by mouth every morning.     . Prenatal Vit-Fe Fumarate-FA (PRENATAL MULTIVITAMIN) TABS tablet Take 1 tablet by mouth at bedtime.   12/26/2016 at Unknown time  . promethazine (PHENERGAN) 25 MG tablet Take 1 tablet (25 mg total) by mouth every 6 (six) hours as needed. (Patient not taking: Reported on 07/30/2016) 30 tablet 1 Not Taking  . RaNITidine HCl (ZANTAC PO) Take 150 mg by mouth daily. Patient not sure of dose   12/26/2016 at Unknown time    Review of Systems  Constitutional: Negative.  Negative for fatigue and fever.  HENT: Negative.   Respiratory: Negative.  Negative for shortness of breath.   Cardiovascular: Negative.  Negative for chest pain.  Gastrointestinal: Positive for nausea and vomiting. Negative for abdominal pain, constipation and diarrhea.  Genitourinary: Negative.  Negative for dysuria, vaginal bleeding and vaginal discharge.  Neurological: Negative.  Negative for dizziness and headaches.   Physical Exam   Blood  pressure 122/80, pulse 97, temperature (!) 97.3 F (36.3 C), temperature source Oral, resp. rate 18, weight 94.5 kg, last menstrual period 09/19/2018, unknown if currently breastfeeding.  Physical Exam  Nursing note and vitals reviewed. Constitutional: She is oriented to person, place, and time. She appears well-developed and well-nourished. No distress.  HENT:  Head: Normocephalic.  Eyes: Pupils are equal, round, and reactive to light.  Cardiovascular: Normal rate, regular rhythm and normal heart sounds.  Respiratory: Effort normal and breath sounds normal. No respiratory distress.  GI: Soft. Bowel sounds are normal. She exhibits no distension. There is no abdominal tenderness.  Neurological: She is alert and oriented to person, place, and time.  Skin: Skin is warm and dry.  Psychiatric: She has a normal mood and affect. Her behavior is normal. Judgment and thought content normal.    MAU Course  Procedures Results for orders placed or performed during the hospital encounter of 11/18/18 (from the past 24 hour(s))  Urinalysis, Routine w reflex microscopic     Status: Abnormal   Collection Time: 11/18/18  2:43 PM  Result Value Ref Range   Color, Urine AMBER (A) YELLOW   APPearance CLOUDY (A) CLEAR   Specific Gravity, Urine 1.028 1.005 - 1.030   pH 5.0 5.0 - 8.0   Glucose, UA NEGATIVE NEGATIVE mg/dL   Hgb urine dipstick NEGATIVE NEGATIVE   Bilirubin Urine NEGATIVE NEGATIVE   Ketones, ur NEGATIVE NEGATIVE mg/dL   Protein, ur NEGATIVE NEGATIVE mg/dL   Nitrite NEGATIVE NEGATIVE   Leukocytes, UA TRACE (A) NEGATIVE   RBC / HPF 21-50 0 - 5 RBC/hpf   WBC, UA 11-20 0 - 5 WBC/hpf   Bacteria, UA NONE SEEN NONE SEEN   Squamous Epithelial / LPF 0-5 0 - 5   Mucus PRESENT    Ca Oxalate Crys, UA PRESENT   Pregnancy, urine POC     Status: Abnormal   Collection Time: 11/18/18  2:45 PM  Result Value Ref Range   Preg Test, Ur POSITIVE (A) NEGATIVE  CBC     Status: None   Collection Time:  11/18/18  3:28 PM  Result Value Ref Range   WBC 6.2 4.0 - 10.5 K/uL   RBC 4.19 3.87 - 5.11 MIL/uL   Hemoglobin 12.6 12.0 - 15.0 g/dL   HCT 85.0 27.7 - 41.2 %   MCV 90.9 80.0 - 100.0 fL   MCH 30.1 26.0 - 34.0 pg   MCHC 33.1 30.0 - 36.0 g/dL   RDW 87.8 67.6 -  15.5 %   Platelets 262 150 - 400 K/uL   nRBC 0.0 0.0 - 0.2 %  Comprehensive metabolic panel     Status: Abnormal   Collection Time: 11/18/18  3:28 PM  Result Value Ref Range   Sodium 136 135 - 145 mmol/L   Potassium 3.7 3.5 - 5.1 mmol/L   Chloride 105 98 - 111 mmol/L   CO2 23 22 - 32 mmol/L   Glucose, Bld 103 (H) 70 - 99 mg/dL   BUN 8 6 - 20 mg/dL   Creatinine, Ser 4.090.58 0.44 - 1.00 mg/dL   Calcium 9.8 8.9 - 81.110.3 mg/dL   Total Protein 8.6 (H) 6.5 - 8.1 g/dL   Albumin 4.4 3.5 - 5.0 g/dL   AST 19 15 - 41 U/L   ALT 17 0 - 44 U/L   Alkaline Phosphatase 47 38 - 126 U/L   Total Bilirubin 0.9 0.3 - 1.2 mg/dL   GFR calc non Af Amer >60 >60 mL/min   GFR calc Af Amer >60 >60 mL/min   Anion gap 8 5 - 15   MDM UA CBC, CMP LR bolus Phenergan IV Patient reports relief from nausea  Assessment and Plan   1. Nausea and vomiting during pregnancy prior to [redacted] weeks gestation   2. [redacted] weeks gestation of pregnancy    -Discharge home in stable condition -Rx for phenergan sent to patient's pharmacy. Encouraged patient to continue diclegis as prescribed -Nausea and vomiting precautions discussed -Patient advised to follow-up with [facility] as scheduled for prenatal care -Patient may return to MAU as needed or if her condition were to change or worsen  Rolm BookbinderCaroline M Malaisha Silliman CNM 11/18/2018, 2:55 PM

## 2018-12-03 LAB — OB RESULTS CONSOLE ABO/RH: RH Type: POSITIVE

## 2018-12-03 LAB — OB RESULTS CONSOLE RUBELLA ANTIBODY, IGM: Rubella: IMMUNE

## 2018-12-03 LAB — OB RESULTS CONSOLE GC/CHLAMYDIA
Chlamydia: NEGATIVE
Gonorrhea: NEGATIVE

## 2018-12-03 LAB — OB RESULTS CONSOLE HEPATITIS B SURFACE ANTIGEN: Hepatitis B Surface Ag: NEGATIVE

## 2018-12-03 LAB — OB RESULTS CONSOLE ANTIBODY SCREEN: Antibody Screen: NEGATIVE

## 2018-12-03 LAB — OB RESULTS CONSOLE HIV ANTIBODY (ROUTINE TESTING): HIV: NONREACTIVE

## 2018-12-03 LAB — OB RESULTS CONSOLE RPR: RPR: NONREACTIVE

## 2018-12-29 ENCOUNTER — Inpatient Hospital Stay (HOSPITAL_COMMUNITY)
Admission: AD | Admit: 2018-12-29 | Discharge: 2018-12-29 | Disposition: A | Discharge status: Home / Self Care | Payer: BLUE CROSS/BLUE SHIELD | Attending: Obstetrics & Gynecology | Admitting: Obstetrics & Gynecology

## 2018-12-29 ENCOUNTER — Encounter (HOSPITAL_COMMUNITY): Payer: Self-pay | Admitting: *Deleted

## 2018-12-29 DIAGNOSIS — O26892 Other specified pregnancy related conditions, second trimester: Secondary | ICD-10-CM | POA: Insufficient documentation

## 2018-12-29 DIAGNOSIS — Z3A14 14 weeks gestation of pregnancy: Secondary | ICD-10-CM

## 2018-12-29 DIAGNOSIS — N898 Other specified noninflammatory disorders of vagina: Secondary | ICD-10-CM | POA: Diagnosis not present

## 2018-12-29 LAB — URINALYSIS, ROUTINE W REFLEX MICROSCOPIC
Bilirubin Urine: NEGATIVE
Glucose, UA: NEGATIVE mg/dL
Hgb urine dipstick: NEGATIVE
Ketones, ur: NEGATIVE mg/dL
Leukocytes,Ua: NEGATIVE
Nitrite: NEGATIVE
Protein, ur: NEGATIVE mg/dL
Specific Gravity, Urine: <1.005 — ABNORMAL LOW (ref 1.005–1.030)
pH: 6 (ref 5.0–8.0)

## 2018-12-29 LAB — WET PREP, GENITAL
CLUE CELLS WET PREP: NONE SEEN
Sperm: NONE SEEN
Trich, Wet Prep: NONE SEEN
Yeast Wet Prep HPF POC: NONE SEEN

## 2018-12-29 NOTE — MAU Note (Signed)
Pt presents to MAU c/o a clear vaginal discharge pt states she has been having a normal discharge but today around 1700 she had a gush of clear fluid and since then has had a couple episodes of gushes. Pt has worn 2 panty liners since 1700. Pt denies bleeding or pain. Pt reports last intercourse was Saturday and it was painful pt was concerned.

## 2018-12-29 NOTE — MAU Provider Note (Signed)
Chief Complaint:  Vaginal Discharge   First Provider Initiated Contact with Patient 12/29/18 2209      HPI: Amy Rodgers is a 33 y.o. G2P1001 at 4714w3dwho presents to maternity admissions reporting leaking clear fluid from vagina tonight.  States it was a large gush twice.  Also reports last intercourse was a sensation of irritation and fullness/pressure.  . She denies vaginal bleeding, vaginal itching/burning, urinary symptoms, h/a, dizziness, n/v, diarrhea, constipation or fever/chills.    RN note: Pt presents to MAU c/o a clear vaginal discharge pt states she has been having a normal discharge but today around 1700 she had a gush of clear fluid and since then has had a couple episodes of gushes. Pt has worn 2 panty liners since 1700. Pt denies bleeding or pain. Pt reports last intercourse was Saturday and it was painful pt was concerned  Past Medical History: Past Medical History:  Diagnosis Date  . Anemia    Borderline  . Anxiety   . Asthma   . Complication of anesthesia    "freaks out waking up from surgery"  . Depression   . GERD (gastroesophageal reflux disease)   . Gestational diabetes   . IBS (irritable bowel syndrome)   . Kidney stones   . Mononucleosis   . Pneumonia    10/16  . PONV (postoperative nausea and vomiting)   . Pre-diabetes     Past obstetric history: OB History  Gravida Para Term Preterm AB Living  2 1 1     1   SAB TAB Ectopic Multiple Live Births        0 1    # Outcome Date GA Lbr Len/2nd Weight Sex Delivery Anes PTL Lv  2 Current           1 Term 12/28/16 4342w4d  3215 g F CS-LTranv EPI  LIV    Past Surgical History: Past Surgical History:  Procedure Laterality Date  . BACK SURGERY    . CESAREAN SECTION N/A 12/28/2016   Procedure: CESAREAN SECTION;  Surgeon: Genia DelMarie-Lyne Lavoie, MD;  [facility];  Service: Obstetrics;  Laterality: N/A;  . CHROMOPERTUBATION Bilateral 10/26/2015   Procedure: CHROMOPERTUBATION;  Surgeon: Shea EvansVaishali  Mody, MD;  Location: [facility];  Service: Gynecology;  Laterality: Bilateral;  . COLONOSCOPY    . Kidney stent    . KNEE SURGERY Left   . LAPAROSCOPY N/A 10/26/2015   Procedure: LAPAROSCOPY DIAGNOSTIC ;  Surgeon: Shea EvansVaishali Mody, MD;  Location: [facility];  Service: Gynecology;  Laterality: N/A;  . TONSILLECTOMY    . UPPER GASTROINTESTINAL ENDOSCOPY      Family History: Family History  Problem Relation Age of Onset  . Hypertension Mother   . Hypertension Father   . Heart disease Father   . Cancer Maternal Aunt   . Cancer Maternal Grandmother     Social History: Social History   Tobacco Use  . Smoking status: Never Smoker  . Smokeless tobacco: Never Used  Substance Use Topics  . Alcohol use: No  . Drug use: No    Allergies:  Allergies  Allergen Reactions  . Codeine Other (See Comments)    Headaches, itching    Meds:  Medications Prior to Admission  Medication Sig Dispense Refill Last Dose  . famotidine (PEPCID) 10 MG tablet Take 10 mg by mouth 2 (two) times daily.   12/29/2018 at Unknown time  . metFORMIN (GLUCOPHAGE) 500 MG tablet Take 500 mg by mouth once.     . ondansetron (  ZOFRAN) 4 MG tablet Take 4 mg by mouth every 8 (eight) hours as needed for nausea or vomiting.   12/29/2018 at Unknown time  . Prenatal Vit-Fe Fumarate-FA (PRENATAL MULTIVITAMIN) TABS tablet Take 1 tablet by mouth at bedtime.   12/28/2018 at Unknown time  . promethazine (PHENERGAN) 25 MG tablet Take 1 tablet (25 mg total) by mouth every 6 (six) hours as needed. 30 tablet 1   . RaNITidine HCl (ZANTAC PO) Take 150 mg by mouth daily. Patient not sure of dose   More than a month at Unknown time    I have reviewed patient's Past Medical Hx, Surgical Hx, Family Hx, Social Hx, medications and allergies.   ROS:  Review of Systems  Constitutional: Negative for activity change, fatigue and fever.  Respiratory: Negative for shortness of breath.   Gastrointestinal: Negative for abdominal pain, constipation and  diarrhea.  Genitourinary: Positive for dyspareunia and vaginal discharge. Negative for difficulty urinating, dysuria, frequency, pelvic pain and vaginal bleeding.   Other systems negative  Physical Exam   Patient Vitals for the past 24 hrs:  BP Temp Temp src Pulse  12/29/18 2159 (!) 108/59 - - 75  12/29/18 2149 (!) 98/35 97.7 F (36.5 C) Oral 85   Constitutional: Well-developed, well-nourished female in no acute distress.  Cardiovascular: normal rate and rhythm Respiratory: normal effort, clear to auscultation bilaterally GI: Abd soft, non-tender, gravid appropriate for gestational age.   No rebound or guarding. MS: Extremities nontender, no edema, normal ROM Neurologic: Alert and oriented x 4.  GU: Neg CVAT.  PELVIC EXAM: Cervix pink, visually closed, without lesion, moderate white creamy discharge, vaginal walls and external genitalia normal   No pooling   No tenderness over uterus  FHT:  155   Labs: Results for orders placed or performed during the hospital encounter of 12/29/18 (from the past 24 hour(s))  Urinalysis, Routine w reflex microscopic     Status: Abnormal   Collection Time: 12/29/18 10:08 PM  Result Value Ref Range   Color, Urine YELLOW YELLOW   APPearance CLEAR CLEAR   Specific Gravity, Urine <1.005 (L) 1.005 - 1.030   pH 6.0 5.0 - 8.0   Glucose, UA NEGATIVE NEGATIVE mg/dL   Hgb urine dipstick NEGATIVE NEGATIVE   Bilirubin Urine NEGATIVE NEGATIVE   Ketones, ur NEGATIVE NEGATIVE mg/dL   Protein, ur NEGATIVE NEGATIVE mg/dL   Nitrite NEGATIVE NEGATIVE   Leukocytes,Ua NEGATIVE NEGATIVE  Wet prep, genital     Status: Abnormal   Collection Time: 12/29/18 10:25 PM  Result Value Ref Range   Yeast Wet Prep HPF POC NONE SEEN NONE SEEN   Trich, Wet Prep NONE SEEN NONE SEEN   Clue Cells Wet Prep HPF POC NONE SEEN NONE SEEN   WBC, Wet Prep HPF POC MODERATE (A) NONE SEEN   Sperm NONE SEEN       Imaging:  Bedside US showed normal size gestational sac, rounded  with membranes visible at times    Active fetus, with HR 155, Anterior placenta.  Amniotic sac appears full and is of normal configuration with fluid surrounding fetus.   MAU Course/MDM: I have ordered labs and reviewed results.  Normal leukorrhea  Treatments in MAU included Speculum exam (neg for ROM),  wet prep, bedside US.    Assessment: Single intrauterine pregnancy at 1569w4d No evidence of leaking amniotic fluid Leukorrhea  Plan: Discharge home Preterm Labor precautions and fetal kick counts Follow up in Office for prenatal visits and recheck of status  Encouraged to return here or to other Urgent Care/ED if she develops worsening of symptoms, increase in pain, fever, or other concerning symptoms.   Pt stable at time of discharge.  Wynelle Bourgeois CNM, MSN Certified Nurse-Midwife 12/29/2018 10:09 PM

## 2018-12-29 NOTE — Discharge Instructions (Signed)

## 2019-05-31 ENCOUNTER — Other Ambulatory Visit: Payer: Self-pay | Admitting: Obstetrics & Gynecology

## 2019-06-08 ENCOUNTER — Telehealth (HOSPITAL_COMMUNITY): Payer: Self-pay | Admitting: *Deleted

## 2019-06-08 NOTE — Telephone Encounter (Signed)
Preadmission screen  

## 2019-06-08 NOTE — Patient Instructions (Addendum)
Amy Rodgers  06/08/2019   Your procedure is scheduled on:  06/21/2019  Arrive at 33 at Entrance C on [address] at [facility]  and [address]. You are invited to use the FREE valet parking or use the Visitor's parking deck.  Pick up the phone at the desk and dial [phone].  Call this number if you have problems the morning of surgery: [phone]  Remember:   Do not eat food:(After Midnight) Desps de medianoche.  Do not drink clear liquids: (After Midnight) Desps de medianoche.  Take these medicines the morning of surgery with A SIP OF WATER:  Do not take metformin the day of surgery or the day of surgery. Take 12 units of insulin the night before surgery.   Do not wear jewelry, make-up or nail polish.  Do not wear lotions, powders, or perfumes. Do not wear deodorant.  Do not shave 48 hours prior to surgery.  Do not bring valuables to the hospital.  [facility] is not   responsible for any belongings or valuables brought to the hospital.  Contacts, dentures or bridgework may not be worn into surgery.  Leave suitcase in the car. After surgery it may be brought to your room.  For patients admitted to the hospital, checkout time is 11:00 AM the day of              discharge.      Please read over the following fact sheets that you were given:     Preparing for Surgery

## 2019-06-09 ENCOUNTER — Telehealth (HOSPITAL_COMMUNITY): Payer: Self-pay | Admitting: *Deleted

## 2019-06-09 NOTE — Telephone Encounter (Signed)
Preadmission screen  

## 2019-06-10 ENCOUNTER — Encounter (HOSPITAL_COMMUNITY): Payer: Self-pay

## 2019-06-17 ENCOUNTER — Other Ambulatory Visit: Payer: Self-pay

## 2019-06-17 ENCOUNTER — Other Ambulatory Visit (HOSPITAL_COMMUNITY)
Admission: RE | Admit: 2019-06-17 | Discharge: 2019-06-17 | Disposition: A | Discharge status: Home / Self Care | Payer: BC Managed Care – PPO | Source: Ambulatory Visit | Attending: Obstetrics & Gynecology | Admitting: Obstetrics & Gynecology

## 2019-06-17 DIAGNOSIS — Z20828 Contact with and (suspected) exposure to other viral communicable diseases: Secondary | ICD-10-CM | POA: Insufficient documentation

## 2019-06-17 LAB — SARS CORONAVIRUS 2 (TAT 6-24 HRS): SARS Coronavirus 2: NEGATIVE

## 2019-06-17 NOTE — MAU Note (Signed)
Swab collected without difficulty. 

## 2019-06-21 ENCOUNTER — Inpatient Hospital Stay (HOSPITAL_COMMUNITY): Payer: BC Managed Care – PPO | Admitting: Certified Registered Nurse Anesthetist

## 2019-06-21 ENCOUNTER — Encounter (HOSPITAL_COMMUNITY): Payer: Self-pay | Admitting: *Deleted

## 2019-06-21 ENCOUNTER — Inpatient Hospital Stay (HOSPITAL_COMMUNITY)
Admission: RE | Admit: 2019-06-21 | Discharge: 2019-06-23 | DRG: 787 | Disposition: A | Discharge status: Home / Self Care | Payer: BC Managed Care – PPO | Attending: Obstetrics & Gynecology | Admitting: Obstetrics & Gynecology

## 2019-06-21 ENCOUNTER — Encounter (HOSPITAL_COMMUNITY)
Admission: RE | Disposition: A | Discharge status: Home / Self Care | Payer: Self-pay | Source: Home / Self Care | Attending: Obstetrics & Gynecology

## 2019-06-21 ENCOUNTER — Other Ambulatory Visit: Payer: Self-pay

## 2019-06-21 DIAGNOSIS — O34211 Maternal care for low transverse scar from previous cesarean delivery: Principal | ICD-10-CM | POA: Diagnosis present

## 2019-06-21 DIAGNOSIS — E669 Obesity, unspecified: Secondary | ICD-10-CM | POA: Diagnosis present

## 2019-06-21 DIAGNOSIS — O24424 Gestational diabetes mellitus in childbirth, insulin controlled: Secondary | ICD-10-CM | POA: Diagnosis present

## 2019-06-21 DIAGNOSIS — O99824 Streptococcus B carrier state complicating childbirth: Secondary | ICD-10-CM | POA: Diagnosis present

## 2019-06-21 DIAGNOSIS — E282 Polycystic ovarian syndrome: Secondary | ICD-10-CM | POA: Diagnosis present

## 2019-06-21 DIAGNOSIS — O99284 Endocrine, nutritional and metabolic diseases complicating childbirth: Secondary | ICD-10-CM | POA: Diagnosis present

## 2019-06-21 DIAGNOSIS — O99214 Obesity complicating childbirth: Secondary | ICD-10-CM | POA: Diagnosis present

## 2019-06-21 DIAGNOSIS — Z3A39 39 weeks gestation of pregnancy: Secondary | ICD-10-CM

## 2019-06-21 DIAGNOSIS — O24419 Gestational diabetes mellitus in pregnancy, unspecified control: Secondary | ICD-10-CM

## 2019-06-21 DIAGNOSIS — O9081 Anemia of the puerperium: Secondary | ICD-10-CM | POA: Diagnosis not present

## 2019-06-21 DIAGNOSIS — D62 Acute posthemorrhagic anemia: Secondary | ICD-10-CM | POA: Diagnosis not present

## 2019-06-21 DIAGNOSIS — Z98891 History of uterine scar from previous surgery: Secondary | ICD-10-CM

## 2019-06-21 DIAGNOSIS — O99210 Obesity complicating pregnancy, unspecified trimester: Secondary | ICD-10-CM | POA: Diagnosis present

## 2019-06-21 HISTORY — DX: Polycystic ovarian syndrome: E28.2

## 2019-06-21 LAB — RPR: RPR Ser Ql: NONREACTIVE

## 2019-06-21 LAB — ABO/RH: ABO/RH(D): O POS

## 2019-06-21 LAB — CBC
HCT: 35.3 % — ABNORMAL LOW (ref 36.0–46.0)
Hemoglobin: 11.4 g/dL — ABNORMAL LOW (ref 12.0–15.0)
MCH: 30.1 pg (ref 26.0–34.0)
MCHC: 32.3 g/dL (ref 30.0–36.0)
MCV: 93.1 fL (ref 80.0–100.0)
Platelets: 177 10*3/uL (ref 150–400)
RBC: 3.79 MIL/uL — ABNORMAL LOW (ref 3.87–5.11)
RDW: 16 % — ABNORMAL HIGH (ref 11.5–15.5)
WBC: 5.2 10*3/uL (ref 4.0–10.5)
nRBC: 0 % (ref 0.0–0.2)

## 2019-06-21 LAB — TYPE AND SCREEN
ABO/RH(D): O POS
Antibody Screen: NEGATIVE

## 2019-06-21 LAB — GLUCOSE, CAPILLARY
Glucose-Capillary: 101 mg/dL — ABNORMAL HIGH (ref 70–99)
Glucose-Capillary: 105 mg/dL — ABNORMAL HIGH (ref 70–99)

## 2019-06-21 SURGERY — Surgical Case
Anesthesia: Spinal

## 2019-06-21 MED ORDER — KETOROLAC TROMETHAMINE 30 MG/ML IJ SOLN
INTRAMUSCULAR | Status: AC
  Filled 2019-06-21: qty 1

## 2019-06-21 MED ORDER — SIMETHICONE 80 MG PO CHEW
80.0000 mg | CHEWABLE_TABLET | ORAL | Status: DC
  Administered 2019-06-21 – 2019-06-23 (×2): 80 mg via ORAL
  Filled 2019-06-21 (×2): qty 1

## 2019-06-21 MED ORDER — FENTANYL CITRATE (PF) 100 MCG/2ML IJ SOLN
INTRAMUSCULAR | Status: AC
  Filled 2019-06-21: qty 2

## 2019-06-21 MED ORDER — KETOROLAC TROMETHAMINE 30 MG/ML IJ SOLN
30.0000 mg | Freq: Once | INTRAMUSCULAR | Status: AC | PRN
  Administered 2019-06-21: 30 mg via INTRAVENOUS

## 2019-06-21 MED ORDER — ACETAMINOPHEN 325 MG PO TABS
650.0000 mg | ORAL_TABLET | ORAL | Status: DC | PRN
  Administered 2019-06-21 – 2019-06-23 (×8): 650 mg via ORAL
  Filled 2019-06-21 (×10): qty 2

## 2019-06-21 MED ORDER — FENTANYL CITRATE (PF) 100 MCG/2ML IJ SOLN
INTRAMUSCULAR | Status: DC | PRN
  Administered 2019-06-21: 15 ug via INTRATHECAL

## 2019-06-21 MED ORDER — SOD CITRATE-CITRIC ACID 500-334 MG/5ML PO SOLN
ORAL | Status: AC
  Filled 2019-06-21: qty 30

## 2019-06-21 MED ORDER — SCOPOLAMINE 1 MG/3DAYS TD PT72
1.0000 | MEDICATED_PATCH | Freq: Once | TRANSDERMAL | Status: AC
  Administered 2019-06-21: 1 via TRANSDERMAL

## 2019-06-21 MED ORDER — NALOXONE HCL 4 MG/10ML IJ SOLN
1.0000 ug/kg/h | INTRAVENOUS | Status: DC | PRN
  Filled 2019-06-21: qty 5

## 2019-06-21 MED ORDER — SCOPOLAMINE 1 MG/3DAYS TD PT72
MEDICATED_PATCH | TRANSDERMAL | Status: AC
  Filled 2019-06-21: qty 1

## 2019-06-21 MED ORDER — FENTANYL CITRATE (PF) 100 MCG/2ML IJ SOLN: 25.0000 ug | INTRAMUSCULAR | Status: DC | PRN

## 2019-06-21 MED ORDER — OXYCODONE HCL 5 MG/5ML PO SOLN: 5.0000 mg | Freq: Once | ORAL | Status: DC | PRN

## 2019-06-21 MED ORDER — FERROUS SULFATE 325 (65 FE) MG PO TABS
325.0000 mg | ORAL_TABLET | Freq: Every day | ORAL | Status: DC
  Administered 2019-06-22 – 2019-06-23 (×2): 325 mg via ORAL
  Filled 2019-06-21 (×2): qty 1

## 2019-06-21 MED ORDER — TETANUS-DIPHTH-ACELL PERTUSSIS 5-2.5-18.5 LF-MCG/0.5 IM SUSP: 0.5000 mL | Freq: Once | INTRAMUSCULAR | Status: DC

## 2019-06-21 MED ORDER — BUPIVACAINE IN DEXTROSE 0.75-8.25 % IT SOLN
INTRATHECAL | Status: DC | PRN
  Administered 2019-06-21: 1.6 mL via INTRATHECAL

## 2019-06-21 MED ORDER — DIPHENHYDRAMINE HCL 25 MG PO CAPS
25.0000 mg | ORAL_CAPSULE | ORAL | Status: DC | PRN
  Administered 2019-06-22: 04:00:00 25 mg via ORAL
  Filled 2019-06-21 (×3): qty 1

## 2019-06-21 MED ORDER — MORPHINE SULFATE (PF) 0.5 MG/ML IJ SOLN
INTRAMUSCULAR | Status: AC
  Filled 2019-06-21: qty 10

## 2019-06-21 MED ORDER — SIMETHICONE 80 MG PO CHEW
80.0000 mg | CHEWABLE_TABLET | Freq: Three times a day (TID) | ORAL | Status: DC
  Administered 2019-06-21 – 2019-06-23 (×6): 80 mg via ORAL
  Filled 2019-06-21 (×6): qty 1

## 2019-06-21 MED ORDER — SIMETHICONE 80 MG PO CHEW: 80.0000 mg | CHEWABLE_TABLET | ORAL | Status: DC | PRN

## 2019-06-21 MED ORDER — PHENYLEPHRINE HCL-NACL 20-0.9 MG/250ML-% IV SOLN
INTRAVENOUS | Status: AC
  Filled 2019-06-21: qty 250

## 2019-06-21 MED ORDER — OXYCODONE HCL 5 MG PO TABS: 5.0000 mg | ORAL_TABLET | Freq: Once | ORAL | Status: DC | PRN

## 2019-06-21 MED ORDER — COCONUT OIL OIL: 1.0000 "application " | TOPICAL_OIL | Status: DC | PRN

## 2019-06-21 MED ORDER — NALBUPHINE HCL 10 MG/ML IJ SOLN
5.0000 mg | INTRAMUSCULAR | Status: DC | PRN
  Filled 2019-06-21: qty 1

## 2019-06-21 MED ORDER — MEPERIDINE HCL 25 MG/ML IJ SOLN: 6.2500 mg | INTRAMUSCULAR | Status: DC | PRN

## 2019-06-21 MED ORDER — MENTHOL 3 MG MT LOZG: 1.0000 | LOZENGE | OROMUCOSAL | Status: DC | PRN

## 2019-06-21 MED ORDER — FAMOTIDINE IN NACL 20-0.9 MG/50ML-% IV SOLN
20.0000 mg | Freq: Once | INTRAVENOUS | Status: AC
  Administered 2019-06-21: 20 mg via INTRAVENOUS
  Filled 2019-06-21: qty 50

## 2019-06-21 MED ORDER — SODIUM CHLORIDE 0.9 % IV SOLN
INTRAVENOUS | Status: DC | PRN
  Administered 2019-06-21: 40 [IU] via INTRAVENOUS

## 2019-06-21 MED ORDER — DIPHENHYDRAMINE HCL 25 MG PO CAPS
25.0000 mg | ORAL_CAPSULE | Freq: Four times a day (QID) | ORAL | Status: DC | PRN
  Administered 2019-06-22 (×2): 25 mg via ORAL

## 2019-06-21 MED ORDER — EPHEDRINE 5 MG/ML INJ
INTRAVENOUS | Status: AC
  Filled 2019-06-21: qty 10

## 2019-06-21 MED ORDER — MORPHINE SULFATE (PF) 0.5 MG/ML IJ SOLN
INTRAMUSCULAR | Status: DC | PRN
  Administered 2019-06-21: .15 mg via INTRATHECAL

## 2019-06-21 MED ORDER — PRENATAL MULTIVITAMIN CH
1.0000 | ORAL_TABLET | Freq: Every day | ORAL | Status: DC
  Administered 2019-06-22 – 2019-06-23 (×2): 1 via ORAL
  Filled 2019-06-21 (×2): qty 1

## 2019-06-21 MED ORDER — SODIUM CHLORIDE 0.9 % IV SOLN
INTRAVENOUS | Status: DC | PRN
  Administered 2019-06-21: 11:00:00 via INTRAVENOUS

## 2019-06-21 MED ORDER — DIBUCAINE (PERIANAL) 1 % EX OINT: 1.0000 "application " | TOPICAL_OINTMENT | CUTANEOUS | Status: DC | PRN

## 2019-06-21 MED ORDER — ONDANSETRON HCL 4 MG/2ML IJ SOLN
INTRAMUSCULAR | Status: AC
  Filled 2019-06-21: qty 2

## 2019-06-21 MED ORDER — CEFAZOLIN SODIUM-DEXTROSE 2-4 GM/100ML-% IV SOLN
INTRAVENOUS | Status: AC
  Filled 2019-06-21: qty 100

## 2019-06-21 MED ORDER — NALOXONE HCL 0.4 MG/ML IJ SOLN: 0.4000 mg | INTRAMUSCULAR | Status: DC | PRN

## 2019-06-21 MED ORDER — SENNOSIDES-DOCUSATE SODIUM 8.6-50 MG PO TABS
2.0000 | ORAL_TABLET | ORAL | Status: DC
  Administered 2019-06-21 – 2019-06-23 (×2): 2 via ORAL
  Filled 2019-06-21 (×2): qty 2

## 2019-06-21 MED ORDER — NALBUPHINE HCL 10 MG/ML IJ SOLN: 5.0000 mg | Freq: Once | INTRAMUSCULAR | Status: DC | PRN

## 2019-06-21 MED ORDER — ZOLPIDEM TARTRATE 5 MG PO TABS: 5.0000 mg | ORAL_TABLET | Freq: Every evening | ORAL | Status: DC | PRN

## 2019-06-21 MED ORDER — SOD CITRATE-CITRIC ACID 500-334 MG/5ML PO SOLN
30.0000 mL | Freq: Once | ORAL | Status: AC
  Administered 2019-06-21: 30 mL via ORAL

## 2019-06-21 MED ORDER — OXYTOCIN 40 UNITS IN NORMAL SALINE INFUSION - SIMPLE MED: 2.5000 [IU]/h | INTRAVENOUS | Status: AC

## 2019-06-21 MED ORDER — WITCH HAZEL-GLYCERIN EX PADS: 1.0000 "application " | MEDICATED_PAD | CUTANEOUS | Status: DC | PRN

## 2019-06-21 MED ORDER — LACTATED RINGERS IV SOLN
INTRAVENOUS | Status: DC
  Administered 2019-06-21 – 2019-06-22 (×2): via INTRAVENOUS

## 2019-06-21 MED ORDER — FAMOTIDINE 20 MG PO TABS: 10.0000 mg | ORAL_TABLET | Freq: Two times a day (BID) | ORAL | Status: DC | PRN

## 2019-06-21 MED ORDER — SODIUM CHLORIDE 0.9% FLUSH: 3.0000 mL | INTRAVENOUS | Status: DC | PRN

## 2019-06-21 MED ORDER — ONDANSETRON HCL 4 MG/2ML IJ SOLN
INTRAMUSCULAR | Status: DC | PRN
  Administered 2019-06-21: 4 mg via INTRAVENOUS

## 2019-06-21 MED ORDER — OXYCODONE HCL 5 MG PO TABS
5.0000 mg | ORAL_TABLET | Freq: Four times a day (QID) | ORAL | Status: DC | PRN
  Administered 2019-06-21 – 2019-06-23 (×6): 5 mg via ORAL
  Filled 2019-06-21 (×6): qty 1

## 2019-06-21 MED ORDER — SODIUM CHLORIDE 0.9 % IV SOLN
INTRAVENOUS | Status: DC | PRN
  Administered 2019-06-21: 11:00:00 60 ug/min via INTRAVENOUS

## 2019-06-21 MED ORDER — ONDANSETRON HCL 4 MG/2ML IJ SOLN: 4.0000 mg | Freq: Once | INTRAMUSCULAR | Status: DC | PRN

## 2019-06-21 MED ORDER — OXYTOCIN 40 UNITS IN NORMAL SALINE INFUSION - SIMPLE MED
INTRAVENOUS | Status: AC
  Filled 2019-06-21: qty 1000

## 2019-06-21 MED ORDER — NALBUPHINE HCL 10 MG/ML IJ SOLN
5.0000 mg | INTRAMUSCULAR | Status: DC | PRN
  Administered 2019-06-21 (×3): 5 mg via SUBCUTANEOUS
  Filled 2019-06-21 (×2): qty 1

## 2019-06-21 MED ORDER — CEFAZOLIN SODIUM-DEXTROSE 2-4 GM/100ML-% IV SOLN
2.0000 g | INTRAVENOUS | Status: AC
  Administered 2019-06-21: 11:00:00 2 g via INTRAVENOUS

## 2019-06-21 MED ORDER — LACTATED RINGERS IV SOLN
INTRAVENOUS | Status: DC
  Administered 2019-06-21 (×3): via INTRAVENOUS

## 2019-06-21 MED ORDER — ONDANSETRON HCL 4 MG/2ML IJ SOLN: 4.0000 mg | Freq: Three times a day (TID) | INTRAMUSCULAR | Status: DC | PRN

## 2019-06-21 MED ORDER — DIPHENHYDRAMINE HCL 50 MG/ML IJ SOLN
INTRAMUSCULAR | Status: AC
  Filled 2019-06-21: qty 1

## 2019-06-21 MED ORDER — PHENYLEPHRINE HCL (PRESSORS) 10 MG/ML IV SOLN
INTRAVENOUS | Status: DC | PRN
  Administered 2019-06-21: 80 ug via INTRAVENOUS
  Administered 2019-06-21: 40 ug via INTRAVENOUS

## 2019-06-21 MED ORDER — DIPHENHYDRAMINE HCL 50 MG/ML IJ SOLN
12.5000 mg | INTRAMUSCULAR | Status: DC | PRN
  Administered 2019-06-21: 12.5 mg via INTRAVENOUS
  Filled 2019-06-21: qty 1

## 2019-06-21 MED ORDER — IBUPROFEN 800 MG PO TABS
800.0000 mg | ORAL_TABLET | Freq: Three times a day (TID) | ORAL | Status: DC
  Administered 2019-06-21 – 2019-06-23 (×6): 800 mg via ORAL
  Filled 2019-06-21 (×6): qty 1

## 2019-06-21 MED ORDER — METFORMIN HCL 500 MG PO TABS
500.0000 mg | ORAL_TABLET | Freq: Every day | ORAL | Status: DC
  Administered 2019-06-21 – 2019-06-22 (×2): 500 mg via ORAL
  Filled 2019-06-21 (×2): qty 1

## 2019-06-21 SURGICAL SUPPLY — 37 items
APL SKNCLS STERI-STRIP NONHPOA (GAUZE/BANDAGES/DRESSINGS) ×1
BENZOIN TINCTURE PRP APPL 2/3 (GAUZE/BANDAGES/DRESSINGS) ×1 IMPLANT
CHLORAPREP W/TINT 26ML (MISCELLANEOUS) ×2 IMPLANT
CLAMP CORD UMBIL (MISCELLANEOUS) IMPLANT
CLOTH BEACON ORANGE TIMEOUT ST (SAFETY) ×2 IMPLANT
DRSG OPSITE POSTOP 4X10 (GAUZE/BANDAGES/DRESSINGS) ×2 IMPLANT
ELECT REM PT RETURN 9FT ADLT (ELECTROSURGICAL) ×2
ELECTRODE REM PT RTRN 9FT ADLT (ELECTROSURGICAL) ×1 IMPLANT
EXTRACTOR VACUUM KIWI (MISCELLANEOUS) IMPLANT
EXTRACTOR VACUUM M CUP 4 TUBE (SUCTIONS) IMPLANT
GLOVE BIO SURGEON STRL SZ7 (GLOVE) ×2 IMPLANT
GLOVE BIOGEL PI IND STRL 7.0 (GLOVE) ×2 IMPLANT
GLOVE BIOGEL PI INDICATOR 7.0 (GLOVE) ×2
GOWN STRL REUS W/TWL LRG LVL3 (GOWN DISPOSABLE) ×4 IMPLANT
KIT ABG SYR 3ML LUER SLIP (SYRINGE) IMPLANT
NDL HYPO 25X5/8 SAFETYGLIDE (NEEDLE) IMPLANT
NEEDLE HYPO 25X5/8 SAFETYGLIDE (NEEDLE) IMPLANT
NS IRRIG 1000ML POUR BTL (IV SOLUTION) ×2 IMPLANT
PACK C SECTION WH (CUSTOM PROCEDURE TRAY) ×2 IMPLANT
PAD OB MATERNITY 4.3X12.25 (PERSONAL CARE ITEMS) ×2 IMPLANT
RTRCTR C-SECT PINK 25CM LRG (MISCELLANEOUS) IMPLANT
STRIP CLOSURE SKIN 1/2X4 (GAUZE/BANDAGES/DRESSINGS) ×1 IMPLANT
SUT MNCRL 0 VIOLET CTX 36 (SUTURE) ×2 IMPLANT
SUT MONOCRYL 0 CTX 36 (SUTURE) ×2
SUT PLAIN 0 NONE (SUTURE) IMPLANT
SUT PLAIN 2 0 (SUTURE)
SUT PLAIN 2 0 XLH (SUTURE) ×1 IMPLANT
SUT PLAIN ABS 2-0 CT1 27XMFL (SUTURE) IMPLANT
SUT VIC AB 0 CT1 27 (SUTURE) ×4
SUT VIC AB 0 CT1 27XBRD ANBCTR (SUTURE) ×2 IMPLANT
SUT VIC AB 2-0 CT1 27 (SUTURE) ×2
SUT VIC AB 2-0 CT1 TAPERPNT 27 (SUTURE) ×1 IMPLANT
SUT VIC AB 4-0 KS 27 (SUTURE) ×2 IMPLANT
SUT VICRYL 0 TIES 12 18 (SUTURE) IMPLANT
TOWEL OR 17X24 6PK STRL BLUE (TOWEL DISPOSABLE) ×2 IMPLANT
TRAY FOLEY W/BAG SLVR 14FR LF (SET/KITS/TRAYS/PACK) IMPLANT
WATER STERILE IRR 1000ML POUR (IV SOLUTION) ×2 IMPLANT

## 2019-06-21 NOTE — Lactation Note (Signed)
This note was copied from a baby's chart. Lactation Consultation Note  Patient Name: Amy Rodgers [id number] Date: 06/21/2019 Reason for consult: Initial assessment;Term;Maternal endocrine disorder Type of Endocrine Disorder?: Diabetes(GDM)  5 hours old FT female who is being exclusively BF by his mother, she's a P2. She BF her first child for 2-3 weeks and the main BF difficulty she had to face were sore nipples due to baby being tongue tied. 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   Mom is very proactive and knowledgeable, she already has an appointment for this baby to have him check out to out rule any restrictions with the specialist that did the clipping for her first baby. She also brought her own special formula to the hospital, [facility] in the rare case scenario that this baby needs to be supplemented. She doesn't really want to, unless is medically necessary.  When [facility] assisted with hand expression, noticed that mom has flat nipples and her tissue is not very compressible, some areola edema was also noted; but mom had several drops of colostrum. Set mom up with a hand pump and breast shells (she brought a bra to the hospital), instructions, cleaning and storage were reviewed as well as milk storage guidelines. RN Dola Argyle also brought some coconut oil, instructed mom how to use it in addition to her colostrum for breast care. Discouraged lanolin, that she got with her [employer] DEBP she brought from home, she'll be using it for pumping at the hospital.  Offered assistance with latch and mom agreed to arouse baby to do STS on the right breast, she tried cross cradle first and baby fed for a few minutes with some audible swallows noted. Baby eager to eat and constantly cueing but required help for positioning and to stay latched due to mom's semi compressible tissue and flat  nipples; but would grab the breast right on, just not as deep. Mom also requested the cross cradle hold on the same breast, baby fed for a total of 13 minutes with a few audible swallows throughout the entire feeding.   Parents were very engaged during [facility] consultation and had lots of questions. Reviewed normal newborn behavior, size of baby's stomach, cluster feeding, feeding cues, lactogenesis II, pumping schedule and BF basics.  Feeding plan:  1. Encouraged mom to feed baby STS 8-12 times/24 hours or sooner if feeding cues are present 2. Mom will try pre-pumping prior feedings, and post pumping with her [employer] DEBP from home 3. She'll start wearing her breast shells today, daytime only  BF brochure, BF resources and feeding diary were reviewed. Parents reported all questions and concerns were answered, they're both aware of [place] OP services and will call PRN.  Maternal Data Formula Feeding for Exclusion: No Has patient been taught Hand Expression?: Yes Does the patient have breastfeeding experience prior to this delivery?: Yes  Feeding Feeding Type: Breast Fed  LATCH Score Latch: Grasps breast easily, tongue down, lips flanged, rhythmical sucking.  Audible Swallowing: A few with stimulation(with breast compressions)  Type of Nipple: Flat  Comfort (Breast/Nipple): Soft / non-tender  Hold (Positioning): Assistance needed to correctly position infant at breast and maintain latch.  LATCH Score: 7  Interventions Interventions: Breast feeding basics reviewed;Assisted with latch;Skin to skin;Breast massage;Hand express;Reverse pressure;Shells;Breast compression;Adjust position;Hand pump;Support pillows;Position options;Coconut oil  Lactation Tools Discussed/Used Tools: Shells;Pump;Coconut oil  Shell Type: Inverted Breast pump type: Manual WIC Program: No Pump Review: Setup, frequency, and cleaning;Milk Storage Initiated by:: MPeck Date initiated:: 06/21/19   Consult  Status Consult Status: Follow-up Date: 06/22/19 Follow-up type: In-patient    Lyne Khurana Venetia ConstableS Sharetha Newson 06/21/2019, 4:27 PM

## 2019-06-21 NOTE — Anesthesia Procedure Notes (Signed)
Spinal  Patient location during procedure: OR Staffing Anesthesiologist: Sayan Aldava E, MD Performed: anesthesiologist  Preanesthetic Checklist Completed: patient identified, surgical consent, pre-op evaluation, timeout performed, IV checked, risks and benefits discussed and monitors and equipment checked Spinal Block Patient position: sitting Prep: site prepped and draped and DuraPrep Patient monitoring: continuous pulse ox, blood pressure and heart rate Approach: midline Location: L3-4 Injection technique: single-shot Needle Needle type: Pencan  Needle gauge: 24 G Needle length: 9 cm Additional Notes Functioning IV was confirmed and monitors were applied. Sterile prep and drape, including hand hygiene and sterile gloves were used. The patient was positioned and the spine was prepped. The skin was anesthetized with lidocaine.  Free flow of clear CSF was obtained prior to injecting local anesthetic into the CSF. The needle was carefully withdrawn. The patient tolerated the procedure well.      

## 2019-06-21 NOTE — Op Note (Signed)
Repeat Low Transverse Cesarean Section Procedure Note   Amy Rodgers  06/21/2019  Indications: Scheduled Proceedure/Maternal Request   Pre-operative Diagnosis: Previous Cesarean Section. 39 weeks. A2GDM  Post-operative Diagnosis: Same   Surgeon:Azucena Fallen, MD - Primary   Assistants: Lars Pinks, CNM  Anesthesia: spinal   Procedure Details:  The patient was seen in the Holding Room. The risks, benefits, complications, treatment options, and expected outcomes were discussed with the patient. The patient concurred with the proposed plan, giving informed consent. identified as [name] and the procedure verified as C-Section Delivery. A Time Out was held and the above information confirmed. 2 gm Ancef given. After induction of anesthesia, the patient was draped and prepped in the usual sterile manner, foley inserted and was draining urine well.  A pfannenstiel incision was made and carried down through the subcutaneous tissue to the fascia. Fascial incision was made and extended transversely. The fascia was separated from the underlying rectus tissue superiorly and inferiorly. The peritoneum was identified and entered. Peritoneal incision was extended longitudinally. Alexis-O retractor placed. The utero-vesical peritoneal reflection was incised transversely and the bladder flap was bluntly freed from the lower uterine segment. A low transverse uterine incision was made. Delivered from cephalic presentation was a Female infant with vigorous cry. Apgar scores of 8 at one minute and 9 at five minutes. Delayed cord clamping done at 1 minute and baby handed to NICU team in attendance. Cord ph was not sent. Cord blood was obtained for evaluation. The placenta was removed Intact and appeared normal. The uterine outline, tubes and ovaries appeared normal}. The uterine incision was closed with running locked sutures of 0Monocryl. A second imbricating layer sutured.   Hemostasis was observed.  Alexis retractor removed. Peritoneal closure done with 2-0 Vicryl.  The fascia was then reapproximated with running sutures of 0Vicryl. The subcuticular closure was performed using 2-0plain gut. The skin was closed with 4-0Vicryl.   Instrument, sponge, and needle counts were correct prior the abdominal closure and were correct at the conclusion of the case.   Findings: Female infant delivered cephalic from [facility] hysterotomy at 11.19 AM. Apgar 8 and 9. Copious clear amniotic fluid. Normal uterus, tubes, ovaries, placenta, cord. Loose nuchal cord released at head delivery   Estimated Blood Loss: 281 mL   Total IV Fluids: 2800 ml LR  Urine Output: 100CC OF clear urine  Specimens: cord blood   Complications: no complications  Disposition: PACU - hemodynamically stable.   Maternal Condition: stable   Baby condition / location:  Couplet care / Skin to Skin  Attending Attestation: I performed the procedure.   Signed: Surgeon(s): Azucena Fallen, MD

## 2019-06-21 NOTE — Transfer of Care (Signed)
Immediate Anesthesia Transfer of Care Note  Patient: Amy Rodgers  Procedure(s) Performed: Repeat CESAREAN SECTION (N/A )  Patient Location: PACU  Anesthesia Type:Spinal  Level of Consciousness: awake, alert  and oriented  Airway & Oxygen Therapy: Patient Spontanous Breathing  Post-op Assessment: Report given to RN and Post -op Vital signs reviewed and stable  Post vital signs: Reviewed and stable  Last Vitals:  Vitals Value Taken Time  BP 122/75 06/21/19 1215  Temp    Pulse 69 06/21/19 1216  Resp 15 06/21/19 1216  SpO2 100 % 06/21/19 1216  Vitals shown include unvalidated device data.  Last Pain:  Vitals:   06/21/19 0729  TempSrc: Oral         Complications: No apparent anesthesia complications

## 2019-06-21 NOTE — Anesthesia Preprocedure Evaluation (Signed)
Anesthesia Evaluation  Patient identified by MRN, date of birth, ID band Patient awake    Reviewed: Allergy & Precautions, H&P , NPO status , Patient's Chart, lab work & pertinent test results  History of Anesthesia Complications Negative for: history of anesthetic complications  Airway Mallampati: II  TM Distance: >3 FB Neck ROM: full    Dental no notable dental hx.    Pulmonary asthma ,    Pulmonary exam normal        Cardiovascular negative cardio ROS Normal cardiovascular exam     Neuro/Psych PSYCHIATRIC DISORDERS Anxiety Depression negative neurological ROS     GI/Hepatic Neg liver ROS, GERD  ,  Endo/Other  diabetes, Gestational, Insulin Dependent, Oral Hypoglycemic Agents  Renal/GU negative Renal ROS  negative genitourinary   Musculoskeletal   Abdominal   Peds  Hematology negative hematology ROS (+)   Anesthesia Other Findings   Reproductive/Obstetrics (+) Pregnancy                             Anesthesia Physical Anesthesia Plan  ASA: III  Anesthesia Plan: Spinal   Post-op Pain Management:    Induction:   PONV Risk Score and Plan: Ondansetron and Treatment may vary due to age or medical condition  Airway Management Planned:   Additional Equipment:   Intra-op Plan:   Post-operative Plan:   Informed Consent: I have reviewed the patients History and Physical, chart, labs and discussed the procedure including the risks, benefits and alternatives for the proposed anesthesia with the patient or authorized representative who has indicated his/her understanding and acceptance.       Plan Discussed with:   Anesthesia Plan Comments:         Anesthesia Quick Evaluation

## 2019-06-21 NOTE — H&P (Signed)
Amy Rodgers is a 33 y.o. female presenting for Repeat C-section.  G2P1001, PCOS, Metformin/ Femara conception.  A2GDM since 16 wks with failed 1hr Glucola, declined 3hr GTT due to h/o GDM. Restarted Metformin at 16 wks, increased to 750mg  XL at 20 wks and added Lantus Insulin at 26 wks for high fasting BS, last Lantus dose 28 units.  Growth sono serially, AGA, last at 36 wks, 6'13" at 82%, AC 89%. NST/BPP 10/10 since 32 wks  G1- C/section for arrest of descent .   OB History    Gravida  2   Para  1   Term  1   Preterm      AB      Living  1     SAB      TAB      Ectopic      Multiple  0   Live Births  1          Past Medical History:  Diagnosis Date  . Anemia    Borderline  . Anxiety   . Asthma   . Complication of anesthesia    "freaks out waking up from surgery"  . Depression   . GERD (gastroesophageal reflux disease)   . Gestational diabetes   . IBS (irritable bowel syndrome)   . Kidney stones   . Mononucleosis   . Pneumonia    10/16  . PONV (postoperative nausea and vomiting)   . Pre-diabetes    Past Surgical History:  Procedure Laterality Date  . BACK SURGERY    . CESAREAN SECTION N/A 12/28/2016   Procedure: CESAREAN SECTION;  Surgeon: Princess Bruins, MD;  Location: [place];  Service: Obstetrics;  Laterality: N/A;  . CHROMOPERTUBATION Bilateral 10/26/2015   Procedure: CHROMOPERTUBATION;  Surgeon: Azucena Fallen, MD;  Location: [facility];  Service: Gynecology;  Laterality: Bilateral;  . COLONOSCOPY    . Kidney stent    . KNEE SURGERY Left   . LAPAROSCOPY N/A 10/26/2015   Procedure: LAPAROSCOPY DIAGNOSTIC ;  Surgeon: Azucena Fallen, MD;  Location: [facility];  Service: Gynecology;  Laterality: N/A;  . TONSILLECTOMY    . UPPER GASTROINTESTINAL ENDOSCOPY     Family History: family history includes Cancer in her maternal aunt, maternal grandfather, and maternal grandmother; Diabetes in her father; Heart disease in her father; Hypertension in  her father and mother. Social History:  reports that she has never smoked. She has never used smokeless tobacco. She reports that she does not drink alcohol or use drugs.     Maternal Diabetes: Yes:  Diabetes Type:  Insulin/Medication controlled Genetic Screening: Normal Maternal Ultrasounds/Referrals: Normal Fetal Ultrasounds or other Referrals:  None Maternal Substance Abuse:  No Significant Maternal Medications:  Meds include: Other: Insulin, Metformin  Significant Maternal Lab Results:  Group B Strep positive Other Comments:  None  ROS History   Blood pressure 108/79, pulse 84, temperature 98.1 F (36.7 C), temperature source Oral, resp. rate 18, height 5\' 4"  (1.626 m), weight 98 kg, last menstrual period 09/19/2018, SpO2 100 %, unknown if currently breastfeeding. Exam Physical Exam  Physical exam:  A&O x 3, no acute distress. Pleasant HEENT neg, no thyromegaly Lungs CTA bilat CV RRR, S1S2 normal Abdo soft, non tender, non acute Extr no edema/ tenderness Pelvic def FHT  140s Toco none  Prenatal labs: ABO, Rh: O/Positive/-- (01/16 0000) Antibody: Negative (01/16 0000) Rubella: Immune (01/16 0000) RPR: Nonreactive (01/16 0000)  HBsAg: Negative (01/16 0000)  HIV: Non-reactive (01/16 0000)  GBS:  positive (05/24/19)  Assessment/Plan: 33 yo G2P1001, 39 wks, here for repeat C-section for previous c/s and declines TOLAC.  A2GDM, took 1/2 dose insulin last night at 14 units.  Risks/complications of surgery reviewed incl infection, bleeding, damage to internal organs including bladder, bowels, ureters, blood vessels, other risks from anesthesia, VTE and delayed complications of any surgery, complications in future surgery reviewed. Also discussed neonatal complications incl difficult delivery, laceration, vacuum assistance, TTN etc. Pt understands and agrees, all concerns addressed.     Robley FriesVaishali R Liviya Santini 06/21/2019, 7:38 AM

## 2019-06-21 NOTE — Anesthesia Postprocedure Evaluation (Signed)
Anesthesia Post Note  Patient: Amy Rodgers  Procedure(s) Performed: Repeat CESAREAN SECTION (N/A )     Patient location during evaluation: PACU Anesthesia Type: Spinal Level of consciousness: oriented and awake and alert Pain management: pain level controlled Vital Signs Assessment: post-procedure vital signs reviewed and stable Respiratory status: spontaneous breathing, respiratory function stable and nonlabored ventilation Cardiovascular status: blood pressure returned to baseline and stable Postop Assessment: no headache, no backache, no apparent nausea or vomiting and spinal receding Anesthetic complications: no    Last Vitals:  Vitals:   06/21/19 1300 06/21/19 1313  BP: 104/76 101/74  Pulse: (!) 58 (!) 54  Resp: 16 13  Temp:    SpO2: 100% 100%    Last Pain:  Vitals:   06/21/19 1313  TempSrc:   PainSc: 2    Pain Goal:    LLE Motor Response: Purposeful movement (06/21/19 1313)   RLE Motor Response: Purposeful movement (06/21/19 1313)       Epidural/Spinal Function Cutaneous sensation: Able to Discern Pressure (06/21/19 1313), Patient able to flex knees: Yes (06/21/19 1313), Patient able to lift hips off bed: No (06/21/19 1313), Back pain beyond tenderness at insertion site: No (06/21/19 1313), Progressively worsening motor and/or sensory loss: No (06/21/19 1313), Bowel and/or bladder incontinence post epidural: No (06/21/19 1313)  Lidia Collum

## 2019-06-22 ENCOUNTER — Encounter (HOSPITAL_COMMUNITY): Payer: Self-pay | Admitting: Obstetrics & Gynecology

## 2019-06-22 LAB — CBC
HCT: 28.1 % — ABNORMAL LOW (ref 36.0–46.0)
Hemoglobin: 9.2 g/dL — ABNORMAL LOW (ref 12.0–15.0)
MCH: 30.2 pg (ref 26.0–34.0)
MCHC: 32.7 g/dL (ref 30.0–36.0)
MCV: 92.1 fL (ref 80.0–100.0)
Platelets: 131 10*3/uL — ABNORMAL LOW (ref 150–400)
RBC: 3.05 MIL/uL — ABNORMAL LOW (ref 3.87–5.11)
RDW: 16.2 % — ABNORMAL HIGH (ref 11.5–15.5)
WBC: 5.1 10*3/uL (ref 4.0–10.5)
nRBC: 0 % (ref 0.0–0.2)

## 2019-06-22 MED ORDER — HYDROCORTISONE 1 % EX CREA
TOPICAL_CREAM | Freq: Three times a day (TID) | CUTANEOUS | Status: DC
  Administered 2019-06-22 – 2019-06-23 (×3): via TOPICAL
  Filled 2019-06-22: qty 28

## 2019-06-22 NOTE — Progress Notes (Signed)
SVD: repeat  S:  Pt reports feeling  well/ Tolerating po/ Voiding without problems/ No n/v/ Bleeding is moderate/ Pain controlled withnarcotic analgesics including Percocet c/o skin  itching with redness where the hospital panties ] cuase Related to underwear. Had benadryl Request early discharge tomorrow   O:  A & O x 3 / VS: Blood pressure 98/62, pulse 72, temperature 98 F (36.7 C), resp. rate 18, height 5\' 4"  (1.626 m), weight 98 kg, last menstrual period 09/19/2018, SpO2 99 %, unknown if currently breastfeeding.  LABS:  Results for orders placed or performed during the hospital encounter of 06/21/19 (from the past 24 hour(s))  Glucose, capillary     Status: Abnormal   Collection Time: 06/21/19 12:26 PM  Result Value Ref Range   Glucose-Capillary 105 (H) 70 - 99 mg/dL  CBC     Status: Abnormal   Collection Time: 06/22/19  4:27 AM  Result Value Ref Range   WBC 5.1 4.0 - 10.5 K/uL   RBC 3.05 (L) 3.87 - 5.11 MIL/uL   Hemoglobin 9.2 (L) 12.0 - 15.0 g/dL   HCT 28.1 (L) 36.0 - 46.0 %   MCV 92.1 80.0 - 100.0 fL   MCH 30.2 26.0 - 34.0 pg   MCHC 32.7 30.0 - 36.0 g/dL   RDW 16.2 (H) 11.5 - 15.5 %   Platelets 131 (L) 150 - 400 K/uL   nRBC 0.0 0.0 - 0.2 %    I&O: I/O last 3 completed shifts: In: 2800 [I.V.:2800] Out: 2406 [Urine:2125; Blood:281]   Total I/O In: -  Out: 500 [Urine:500]  Lungs: chest clear, no wheezing, rales, normal symmetric air entry  Heart: regular rate and rhythm, S1, S2 normal, no murmur, click, rub or gallop  Abdomen: benign non-tender, without masses or organomegaly palpable and uterus 2 fb above umb. Dressing: stained with dried blood  Perineum: is normal  Lochia: mod  Extremities:edema tr+    A/P: POD # 1/PPD # 1 G2P1001  Doing well  Continue routine post partum orders  D/c home in am if remains stable

## 2019-06-22 NOTE — Lactation Note (Signed)
This note was copied from a baby's chart. Lactation Consultation Note  Patient Name: Amy Rodgers ALPFX'T Date: 06/22/2019 Reason for consult: Difficult latch;Mother's request;Follow-up assessment Type of Endocrine Disorder?: Diabetes P2, 59 hour female infant , weight loss -6%. Per mom, right breast she has bleeding nipples with abrasions mom has flat nipples. Mom was given breast shells, coconut oil and comfort gels by Nurse. Due to  mom's nipple soreness with abrasions on breast she did not want to latch infant on right breast at this time.  Mom latched infant on left breast using football hold, LC ask mom due breast stimulation and hand express prior to latching infant to breast. LC asked mom rub nipple below infant's nose and wait until mouth is wide, bring infant chin first towards breast  with chin and nose touching breast. Infant breast feed 13 minutes sustaining latch. Mom hand express both breast afterwards and infant was given 3 ml of colostrum by spoon. Mom will continue to work towards infant obtaining a deeper latch and will ask assistance from Nurse and / or [place] services.  Mom plans to hand express right breast for 2 feedings and allow breast to heal and will try latch infant again on right breast mid day. Mom has her own personal DEBP and she plans to pump every 3 hours. Mom will preppump prior to latching infant to breast, wear breast shells in bra during the day. Mom knows to breastfeed according hunger cues, 8 to 12 times within 24 hours and on demand.     Maternal Data    Feeding Feeding Type: Breast Fed  LATCH Score Latch: Repeated attempts needed to sustain latch, nipple held in mouth throughout feeding, stimulation needed to elicit sucking reflex.  Audible Swallowing: A few with stimulation  Type of Nipple: Flat  Comfort (Breast/Nipple): Filling, red/small blisters or bruises, mild/mod discomfort  Hold (Positioning): Assistance needed to correctly  position infant at breast and maintain latch.  LATCH Score: 5  Interventions Interventions: Adjust position;Support pillows;Position options;Expressed milk;Pre-pump if needed;Shells;Coconut oil;Comfort gels  Lactation Tools Discussed/Used Tools: Comfort gels;Coconut oil Breast pump type: Other (comment)(MOB own pump)   Consult Status Consult Status: Follow-up Date: 06/22/19 Follow-up type: In-patient    Vicente Serene 06/22/2019, 7:01 AM

## 2019-06-23 DIAGNOSIS — D62 Acute posthemorrhagic anemia: Secondary | ICD-10-CM | POA: Diagnosis not present

## 2019-06-23 MED ORDER — METFORMIN HCL 500 MG PO TABS: 500.0000 mg | ORAL_TABLET | Freq: Two times a day (BID) | ORAL | Status: DC

## 2019-06-23 MED ORDER — IBUPROFEN 800 MG PO TABS: 800.0000 mg | ORAL_TABLET | Freq: Three times a day (TID) | ORAL | 0 refills | Status: DC

## 2019-06-23 MED ORDER — MAGNESIUM OXIDE 400 (241.3 MG) MG PO TABS
400.0000 mg | ORAL_TABLET | Freq: Every day | ORAL | Status: DC
  Administered 2019-06-23: 400 mg via ORAL
  Filled 2019-06-23: qty 1

## 2019-06-23 MED ORDER — METFORMIN HCL 500 MG PO TABS: 500.0000 mg | ORAL_TABLET | Freq: Two times a day (BID) | ORAL | 0 refills | Status: DC

## 2019-06-23 MED ORDER — MAGNESIUM OXIDE 400 (241.3 MG) MG PO TABS: 400.0000 mg | ORAL_TABLET | Freq: Every day | ORAL | 0 refills | Status: DC

## 2019-06-23 MED ORDER — OXYCODONE HCL 5 MG PO TABS: 5.0000 mg | ORAL_TABLET | Freq: Four times a day (QID) | ORAL | 0 refills | Status: AC | PRN

## 2019-06-23 NOTE — Discharge Summary (Signed)
OB Discharge Summary  Patient Name: Amy Rodgers M Greenfield DOB: 08-Jul-1986 MRN: [id number]  Date of admission: 06/21/2019 Intrauterine pregnancy: [id number]   Admitting diagnosis: Previous Cesarean Section Secondary diagnosis: Gestational Diabetes medication controlled (A2)  Date of discharge: 06/23/2019    Discharge diagnosis: Term Pregnancy Delivered  / ABL anemia / glucose intolerance - PCOS   Prenatal history: G2P1001   EDC : 06/26/2019, by Last Menstrual Period  [facility]  Primary provider : MODY Prenatal course complicated by obesity / infertility / PCOS with glucose intolerance / GDM-A2 / GBS (+)  Prenatal Labs: ABO, Rh: --/--/O POS, O POS Performed at [facility], [address] [place], [place] [address]  [phone](08/03 0749)  Antibody: NEG (08/03 0749) Rubella: Immune (01/16 0000)   RPR: Non Reactive (08/03 0739)  HBsAg: Negative (01/16 0000)  HIV: Non-reactive (01/16 0000)  GBS:      positive                                Hospital course:  Sceduled C/S   33 y.o. yo G2P1001 at 3663w4d was admitted to the hospital 06/21/2019 for scheduled cesarean section with the following indication:Elective Repeat.  Membrane Rupture Time/Date: 11:18 AM ,06/21/2019   Patient delivered a Viable infant.06/21/2019  Details of operation can be found in separate operative note.  Pateint had an uncomplicated postpartum course.  She is ambulating, tolerating a regular diet, passing flatus, and urinating well. Patient is discharged home in stable condition on  06/23/19        Delivering PROVIDER: MODY, VAISHALI                                                            Complications: None  Newborn Data: Live born female  Birth Weight: 7 lb 13.8 oz (3565 g) APGAR: 8, 9  Newborn Delivery   Birth date/time: 06/21/2019 11:19:00 Delivery type: C-Section, Low Transverse Trial of labor: No C-section categorization: Repeat      Baby Feeding: Bottle, Breast and  formula Disposition:home with mother  Post partum procedures:none  Labs: Lab Results  Component Value Date   WBC 5.1 06/22/2019   HGB 9.2 (L) 06/22/2019   HCT 28.1 (L) 06/22/2019   MCV 92.1 06/22/2019   PLT 131 (L) 06/22/2019   CMP Latest Ref Rng & Units 11/18/2018  Glucose 70 - 99 mg/dL 562(Z103(H)  BUN 6 - 20 mg/dL 8  Creatinine 3.080.44 - 6.571.00 mg/dL 8.460.58  Sodium 962135 - 952145 mmol/L 136  Potassium 3.5 - 5.1 mmol/L 3.7  Chloride 98 - 111 mmol/L 105  CO2 22 - 32 mmol/L 23  Calcium 8.9 - 10.3 mg/dL 9.8  Total Protein 6.5 - 8.1 g/dL 8.4(X8.6(H)  Total Bilirubin 0.3 - 1.2 mg/dL 0.9  Alkaline Phos 38 - 126 U/L 47  AST 15 - 41 U/L 19  ALT 0 - 44 U/L 17    Physical Exam @ time of discharge:  Vitals:   06/22/19 1419 06/22/19 1959 06/22/19 2159 06/23/19 0638  BP: 97/62 (!) 92/54 (!) 109/56 112/77  Pulse: (!) 59 68 70 60  Resp: 17 16  16   Temp: 98 F (36.7 C) 97.7 F (36.5  C)  97.8 F (36.6 C)  TempSrc: Oral Oral  Oral  SpO2: 99% 100% 100% 100%  Weight:      Height:       general: alert, cooperative and no distress lochia: appropriate uterine fundus: firm perineum: intact incision: Healing well with no significant drainage extremities: DVT Evaluation: No evidence of DVT seen on physical exam.  Discharge instructions:  "Baby and Me Booklet" and [place] Booklet Discharge Medications:  Allergies as of 06/23/2019      Reactions   Codeine Other (See Comments)   Headaches, itching      Medication List    TAKE these medications   famotidine 10 MG tablet Commonly known as: PEPCID Take 10 mg by mouth 2 (two) times daily as needed for heartburn.   ferrous sulfate 325 (65 FE) MG tablet Take 325 mg by mouth daily with breakfast.   ibuprofen 800 MG tablet Commonly known as: ADVIL Take 1 tablet (800 mg total) by mouth every 8 (eight) hours.   magnesium oxide 400 (241.3 Mg) MG tablet Commonly known as: MAG-OX Take 1 tablet (400 mg total) by mouth daily. Start taking on: June 24, 2019   metFORMIN 500 MG tablet Commonly known as: GLUCOPHAGE Take 1 tablet (500 mg total) by mouth 2 (two) times daily with a meal.   oxyCODONE 5 MG immediate release tablet Commonly known as: Oxy IR/ROXICODONE Take 1 tablet (5 mg total) by mouth every 6 (six) hours as needed for up to 3 days for moderate pain.   prenatal multivitamin Tabs tablet Take 1 tablet by mouth at bedtime.            Discharge Care Instructions  (From admission, onward)         Start     Ordered   06/23/19 0000  Discharge wound care:    Comments: Leave honeycomb in place for 5 days - remove if get wet in shower. Leave steri-strips in place x 2 weeks. Keep incision clean and dry   06/23/19 1542         Diet: carb modified diet Activity: Advance as tolerated. Pelvic rest x 6 weeks.  Follow up:6 weeks  Signed: Artelia Laroche CNM, MSN, [facility] 06/23/2019, 3:42 PM

## 2019-06-23 NOTE — Progress Notes (Signed)
POSTOPERATIVE DAY # 2 S/P 2 CS- repeat  Subjective:  reports feeling really sore - lactation assist with latch / painful with nipple trauma tearful this am over not going home today - difficulty feeding and baby weight loss at 9% tolerating po intake / no nausea / no vomiting / + flatus / no BM pain well-controlled  up ad lib / ambulatory in room / voiding QS  Newborn Breast  Objective: VS: BP 112/77 (BP Location: Right Arm)   Pulse 60   Temp 97.8 F (36.6 C) (Oral)   Resp 16   Ht 5\' 4"  (1.626 m)   Wt 98 kg   LMP 09/19/2018   SpO2 100%   BMI 37.08 kg/m        LABS:  Recent Labs    06/21/19 0739 06/22/19 0427  WBC 5.2 5.1  HGB 11.4* 9.2*  PLT 177 131*    Bloodtype: --/--/O POS, O POS Performed at [facility], [place] [address], [place], [place] [address]  [phone])  Rubella: Immune (01/16 0000)                                           I&O: Intake/Output      08/04 0701 - 08/05 0700 08/05 0701 - 08/06 0700   I.V. (mL/kg) 0 (0)    Total Intake(mL/kg) 0 (0)    Urine (mL/kg/hr) 500 (0.2)    Blood     Total Output 500    Net -500          Physical Exam: alert and oriented X3 without any distress or pain lung fields are clear and unlabored heart rate regular / normal rhythm abdomen soft, non-tender, mildly distended  / bowel sounds are active uterine fundus firm, non-tender, U1Fbelow surgical incision assessed with honeycomb dressing intact with dried drainage lochia: light  extremities: trace edema, no calf pain or tenderness  Assessment / Plan:        POD # 2 S/P CS-repeat GDMa2 with pre-existing glucose intolerance secondary to PCOS - Metformin 500mg  BID ABL anemia - iron and magnesium oxide  Lactation assist - comfort measures reviewed  DC tomorrow  Artelia Laroche CNM, MSN, [facility] 06/23/2019, 8:58 AM

## 2019-06-23 NOTE — Lactation Note (Signed)
This note was copied from a baby's chart. Lactation Consultation Note  Patient Name: Amy Rodgers Date: 06/23/2019   Parents say infant is bottle feeding well & that he seems full with the volumes that he is being given. I noted that he is currently sucking on a pacifer, but parents are also preparing to feed him with EBM & formula.  I mentioned to parents that infant may want to consume more since he is at the beginning of his 3rd day of life, possibly about 1 oz/feeding.   With Mom's 1st baby, it took 1 week for her milk to come to volume.   Matthias Hughs [facility] 06/23/2019, 3:06 PM

## 2019-06-23 NOTE — Lactation Note (Signed)
This note was copied from a baby's chart. Lactation Consultation Note Mom asked for [place]. Mom had questions regarding not getting anything when she pumps w/DEBP. Explained to mom that is normal. Mom prefers to use her own DEBP. Assessed breast. Mom has large breast w/flat nipples. Rt. Scabbed. Very tender. Mom doesn't want to latch at this time to the Rt. Breast.  breast w/edema. Nipples not compressible. Explained to mom she will not be able to BF at this time w/o NS. Mom has #24 NS. Mom states nothing is coming out of NS. Encouraged mom to wear shells in am w/bra to help w/edema to areolas.  Mom is supplementing w/her personal powdered milk that MD. Approved per mom d/t her daughter had milk allergy and protein allergy. Baby suckling on pacifier. Discouraged for 2 weeks. Mom stated she knows but she chooses to give one. Encouraged pumping. Mom stated she was every 3 hrs for 15 min.  Patient Name: Amy Rodgers GGYIR'S Date: 06/23/2019 Reason for consult: Mother's request;Nipple pain/trauma;Term   Maternal Data Has patient been taught Hand Expression?: Yes Does the patient have breastfeeding experience prior to this delivery?: Yes  Feeding    LATCH Score       Type of Nipple: Flat  Comfort (Breast/Nipple): Filling, red/small blisters or bruises, mild/mod discomfort        Interventions Interventions: Breast massage;Hand express;Position options;Support pillows;DEBP;Shells;Comfort gels  Lactation Tools Discussed/Used Tools: Pump;Shells;Coconut oil;Comfort gels;Nipple Shields Nipple shield size: 24 Shell Type: Inverted Breast pump type: Double-Electric Breast Pump   Consult Status Consult Status: Follow-up Date: 06/23/19 Follow-up type: In-patient    Teofila Bowery, [place]ta [place] 06/23/2019, 4:02 AM

## 2019-06-27 LAB — BIRTH TISSUE RECOVERY COLLECTION (PLACENTA DONATION)

## 2019-09-18 ENCOUNTER — Encounter (HOSPITAL_COMMUNITY): Payer: Self-pay

## 2019-09-24 ENCOUNTER — Other Ambulatory Visit: Payer: Self-pay | Admitting: Obstetrics & Gynecology

## 2019-09-24 DIAGNOSIS — N6320 Unspecified lump in the left breast, unspecified quadrant: Secondary | ICD-10-CM

## 2019-10-07 ENCOUNTER — Other Ambulatory Visit: Payer: BC Managed Care – PPO

## 2019-10-08 ENCOUNTER — Ambulatory Visit
Admission: RE | Admit: 2019-10-08 | Discharge: 2019-10-08 | Disposition: A | Discharge status: Home / Self Care | Payer: BC Managed Care – PPO | Source: Ambulatory Visit | Attending: Obstetrics & Gynecology | Admitting: Obstetrics & Gynecology

## 2019-10-08 ENCOUNTER — Other Ambulatory Visit: Payer: Self-pay

## 2019-10-08 DIAGNOSIS — N6320 Unspecified lump in the left breast, unspecified quadrant: Secondary | ICD-10-CM

## 2020-07-26 DIAGNOSIS — I10 Essential (primary) hypertension: Secondary | ICD-10-CM | POA: Insufficient documentation

## 2020-10-08 ENCOUNTER — Ambulatory Visit (HOSPITAL_COMMUNITY)
Admission: EM | Admit: 2020-10-08 | Discharge: 2020-10-08 | Disposition: A | Discharge status: Home / Self Care | Payer: BC Managed Care – PPO | Attending: Family Medicine | Admitting: Family Medicine

## 2020-10-08 ENCOUNTER — Ambulatory Visit (HOSPITAL_COMMUNITY): Admit: 2020-10-08 | Discharge status: Absent without leave | Payer: BC Managed Care – PPO

## 2020-10-08 ENCOUNTER — Encounter (HOSPITAL_COMMUNITY): Payer: Self-pay

## 2020-10-08 ENCOUNTER — Other Ambulatory Visit: Payer: Self-pay

## 2020-10-08 DIAGNOSIS — R5383 Other fatigue: Secondary | ICD-10-CM

## 2020-10-08 DIAGNOSIS — J069 Acute upper respiratory infection, unspecified: Secondary | ICD-10-CM | POA: Diagnosis not present

## 2020-10-08 DIAGNOSIS — R059 Cough, unspecified: Secondary | ICD-10-CM | POA: Diagnosis not present

## 2020-10-08 DIAGNOSIS — J40 Bronchitis, not specified as acute or chronic: Secondary | ICD-10-CM | POA: Diagnosis not present

## 2020-10-08 MED ORDER — DEXAMETHASONE SODIUM PHOSPHATE 10 MG/ML IJ SOLN
10.0000 mg | Freq: Once | INTRAMUSCULAR | Status: AC
  Administered 2020-10-08: 10 mg via INTRAMUSCULAR

## 2020-10-08 MED ORDER — BENZONATATE 100 MG PO CAPS: 100.0000 mg | ORAL_CAPSULE | Freq: Three times a day (TID) | ORAL | 0 refills | Status: DC

## 2020-10-08 MED ORDER — DEXAMETHASONE SODIUM PHOSPHATE 10 MG/ML IJ SOLN
INTRAMUSCULAR | Status: AC
  Filled 2020-10-08: qty 1

## 2020-10-08 MED ORDER — AZITHROMYCIN 250 MG PO TABS: 250.0000 mg | ORAL_TABLET | Freq: Every day | ORAL | 0 refills | Status: DC

## 2020-10-08 MED ORDER — FLUCONAZOLE 150 MG PO TABS: ORAL_TABLET | ORAL | 0 refills | Status: DC

## 2020-10-08 MED ORDER — PREDNISONE 10 MG (21) PO TBPK: ORAL_TABLET | Freq: Every day | ORAL | 0 refills | Status: AC

## 2020-10-08 NOTE — Discharge Instructions (Addendum)
I have sent in azithromycin for you to take. Take 2 tablets today, then one tablet daily for the next 4 days.  I have sent in a prednisone taper for you to take for 6 days. 6 tablets on day one, 5 tablets on day two, 4 tablets on day three, 3 tablets on day four, 2 tablets on day five, and 1 tablet on day six.  I have sent in tessalon perles for you to use one capsule every 8 hours as needed for cough.  I have sent in fluconazole in case of yeast. Take one tablet at the onset of symptoms. If symptoms are still present in 3 days, take the second tablet.   Follow up with this office or with primary care if symptoms are persisting.  Follow up in the ER for high fever, trouble swallowing, trouble breathing, other concerning symptoms.

## 2020-10-08 NOTE — ED Provider Notes (Signed)
[facility]   [id number] 10/08/20 Arrival Time: 1531   CC: COVID symptoms  SUBJECTIVE: History from: patient.  JALISE ZAWISTOWSKI is a 34 y.o. female who presents with abrupt onset of nasal congestion, PND, and persistent dry cough for the last 4 weeks. Reports that her children have been sick. Reports that she has had negative Covid, RSV, and flu testing in the last 2 weeks. Denies recent travel. Has negative history of Covid. Has not completed Covid vaccines. Has not taken OTC medications for this. Reports that the cough is worse at night when she is lying down. Has tried Nyquil with little relief. Denies previous symptoms in the past. Denies fever, chills, sinus pain, rhinorrhea, sore throat, SOB, nausea, changes in bowel or bladder habits.    ROS: As per HPI.  All other pertinent ROS negative.     Past Medical History:  Diagnosis Date  . Anemia    Borderline  . Anxiety   . Asthma   . Complication of anesthesia    "freaks out waking up from surgery"  . Depression   . GERD (gastroesophageal reflux disease)   . Gestational diabetes   . IBS (irritable bowel syndrome)   . Kidney stones   . Mononucleosis   . Pneumonia    10/16  . PONV (postoperative nausea and vomiting)   . Pre-diabetes    Past Surgical History:  Procedure Laterality Date  . BACK SURGERY    . CESAREAN SECTION N/A 12/28/2016   Procedure: CESAREAN SECTION;  Surgeon: Genia Del, MD;  Location: [facility] BIRTHING SUITES;  Service: Obstetrics;  Laterality: N/A;  . CESAREAN SECTION N/A 06/21/2019   Procedure: Repeat CESAREAN SECTION;  Surgeon: Shea Evans, MD;  Location: [facility] LD ORS;  Service: Obstetrics;  Laterality: N/A;  EDD: 06/28/19  . CHROMOPERTUBATION Bilateral 10/26/2015   Procedure: CHROMOPERTUBATION;  Surgeon: Shea Evans, MD;  Location: [facility] ORS;  Service: Gynecology;  Laterality: Bilateral;  . COLONOSCOPY    . Kidney stent    . KNEE SURGERY Left   . LAPAROSCOPY N/A 10/26/2015   Procedure:  LAPAROSCOPY DIAGNOSTIC ;  Surgeon: Shea Evans, MD;  Location: [facility] ORS;  Service: Gynecology;  Laterality: N/A;  . TONSILLECTOMY    . UPPER GASTROINTESTINAL ENDOSCOPY     Allergies  Allergen Reactions  . Codeine Other (See Comments)    Headaches, itching   No current facility-administered medications on file prior to encounter.   Current Outpatient Medications on File Prior to Encounter  Medication Sig Dispense Refill  . famotidine (PEPCID) 10 MG tablet Take 10 mg by mouth 2 (two) times daily as needed for heartburn.     . ferrous sulfate 325 (65 FE) MG tablet Take 325 mg by mouth daily with breakfast.    . ibuprofen (ADVIL) 800 MG tablet Take 1 tablet (800 mg total) by mouth every 8 (eight) hours. 30 tablet 0  . magnesium oxide (MAG-OX) 400 (241.3 Mg) MG tablet Take 1 tablet (400 mg total) by mouth daily. 90 tablet 0  . metFORMIN (GLUCOPHAGE) 500 MG tablet Take 1 tablet (500 mg total) by mouth 2 (two) times daily with a meal. 60 tablet 0  . Prenatal Vit-Fe Fumarate-FA (PRENATAL MULTIVITAMIN) TABS tablet Take 1 tablet by mouth at bedtime.     Social History   Socioeconomic History  . Marital status: Married    Spouse name: Not on file  . Number of children: Not on file  . Years of education: Not on file  . Highest education  level: Not on file  Occupational History  . Not on file  Tobacco Use  . Smoking status: Never Smoker  . Smokeless tobacco: Never Used  Vaping Use  . Vaping Use: Never used  Substance and Sexual Activity  . Alcohol use: No  . Drug use: No  . Sexual activity: Yes  Other Topics Concern  . Not on file  Social History Narrative  . Not on file   Social Determinants of Health   Financial Resource Strain:   . Difficulty of Paying Living Expenses: Not on file  Food Insecurity:   . Worried About Programme researcher, broadcasting/film/video in the Last Year: Not on file  . Ran Out of Food in the Last Year: Not on file  Transportation Needs:   . Lack of Transportation (Medical):  Not on file  . Lack of Transportation (Non-Medical): Not on file  Physical Activity:   . Days of Exercise per Week: Not on file  . Minutes of Exercise per Session: Not on file  Stress:   . Feeling of Stress : Not on file  Social Connections:   . Frequency of Communication with Friends and Family: Not on file  . Frequency of Social Gatherings with Friends and Family: Not on file  . Attends Religious Services: Not on file  . Active Member of Clubs or Organizations: Not on file  . Attends Banker Meetings: Not on file  . Marital Status: Not on file  Intimate Partner Violence:   . Fear of Current or Ex-Partner: Not on file  . Emotionally Abused: Not on file  . Physically Abused: Not on file  . Sexually Abused: Not on file   Family History  Problem Relation Age of Onset  . Hypertension Mother   . Hypertension Father   . Heart disease Father   . Diabetes Father   . Cancer Maternal Aunt   . Cancer Maternal Grandmother   . Breast cancer Maternal Grandmother   . Cancer Maternal Grandfather     OBJECTIVE:  Vitals:   10/08/20 1611  BP: 125/88  Pulse: 84  Resp: 18  Temp: 98.2 F (36.8 C)  TempSrc: Oral  SpO2: 100%     General appearance: alert; appears fatigued, but nontoxic; speaking in full sentences and tolerating own secretions HEENT: NCAT; Ears: EACs clear, TMs pearly gray; Eyes: PERRL.  EOM grossly intact. Sinuses: nontender; Nose: nares patent without rhinorrhea, Throat: oropharynx erythematous, cobblestoning present, tonsils non erythematous or enlarged, uvula midline  Neck: supple without LAD Lungs: unlabored respirations, symmetrical air entry; cough: mild; no respiratory distress; diffuse wheezing throughout bilateral lung fields Heart: regular rate and rhythm.  Radial pulses 2+ symmetrical bilaterally Skin: warm and dry Psychological: alert and cooperative; normal mood and affect  LABS:  No results found for this or any previous visit (from the past  24 hour(s)).   ASSESSMENT & PLAN:  1. Upper respiratory tract infection, unspecified type   2. Bronchitis   3. Other fatigue   4. Cough     Meds ordered this encounter  Medications  . azithromycin (ZITHROMAX) 250 MG tablet    Sig: Take 1 tablet (250 mg total) by mouth daily. Take first 2 tablets together, then 1 every day until finished.    Dispense:  6 tablet    Refill:  0    Order Specific Question:   Supervising Provider    Answer:   Merrilee Jansky [id number]  . fluconazole (DIFLUCAN) 150 MG tablet  Sig: Take one tablet at the onset of symptoms. If symptoms are still present 3 days later, take the second tablet.    Dispense:  2 tablet    Refill:  0    Order Specific Question:   Supervising Provider    Answer:   Merrilee Jansky [id number]  . benzonatate (TESSALON) 100 MG capsule    Sig: Take 1 capsule (100 mg total) by mouth every 8 (eight) hours.    Dispense:  21 capsule    Refill:  0    Order Specific Question:   Supervising Provider    Answer:   Merrilee Jansky [id number]  . predniSONE (STERAPRED UNI-PAK 21 TAB) 10 MG (21) TBPK tablet    Sig: Take by mouth daily for 6 days. Take 6 tablets on day 1, 5 tablets on day 2, 4 tablets on day 3, 3 tablets on day 4, 2 tablets on day 5, 1 tablet on day 6    Dispense:  21 tablet    Refill:  0    Order Specific Question:   Supervising Provider    Answer:   Merrilee Jansky [id number]  . dexamethasone (DECADRON) injection 10 mg     Prescribed azithromycin Prescribed tessalon perles Prescribed steroid taper Prescribed fluconazole in case of yeast Declines Covid and flu testing today Get plenty of rest and push fluids Use OTC zyrtec for nasal congestion, runny nose, and/or sore throat Use OTC flonase for nasal congestion and runny nose Use medications daily for symptom relief Use OTC medications like ibuprofen or tylenol as needed fever or pain Call or go to the ED if you have any new or worsening symptoms such as fever,  worsening cough, shortness of breath, chest tightness, chest pain, turning blue, changes in mental status.  Reviewed expectations re: course of current medical issues. Questions answered. Outlined signs and symptoms indicating need for more acute intervention. Patient verbalized understanding. After Visit Summary given.         Moshe Cipro, NP 10/08/20 [phone]

## 2020-10-08 NOTE — ED Triage Notes (Signed)
Pt in with c/o productive cough that started this week. States she has been coughing up "thick white sputum" and says sometime it makes her vomit.  States that she has pressure in the left side of her face  Also c/o fatigue and says her hands have been going numb for the past week.  Denies fever, diarrhea, runny nose  States that she has been covid tested with negative results

## 2020-11-08 IMAGING — MG DIGITAL DIAGNOSTIC BILAT W/ TOMO W/ CAD
6 of 10 series · 6 of 30 positions shown · non-contrast
Comparison: Previous exam(s).

CLINICAL DATA: Mass felt by the patient in the 3 o'clock position
of the left breast for the past 2 months. She is currently breast
feeding. For maternal grandmother was diagnosed breast cancer.The
patient describes the mass as large persistent area of thickening
after pumping milk.

EXAM:
DIGITAL DIAGNOSTIC BILATERAL MAMMOGRAM WITH CAD AND TOMO
ULTRASOUND LEFT BREAST

[L MLO synth-2D]
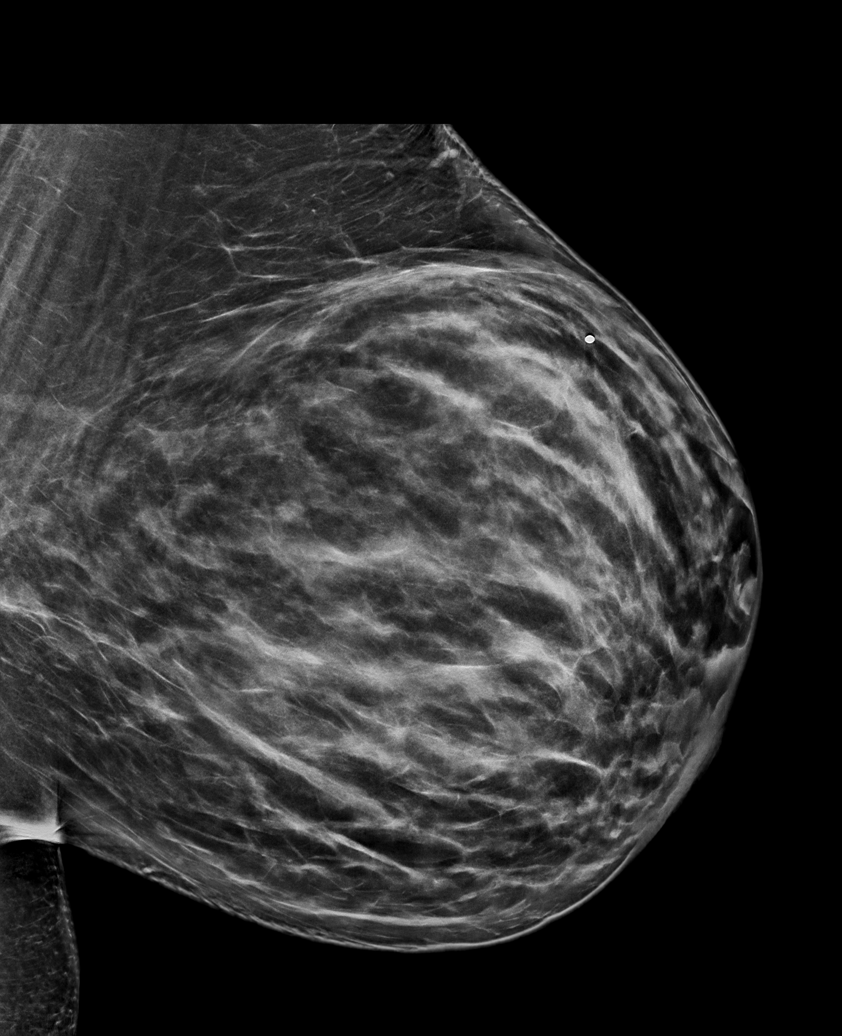

[R MLO synth-2D]
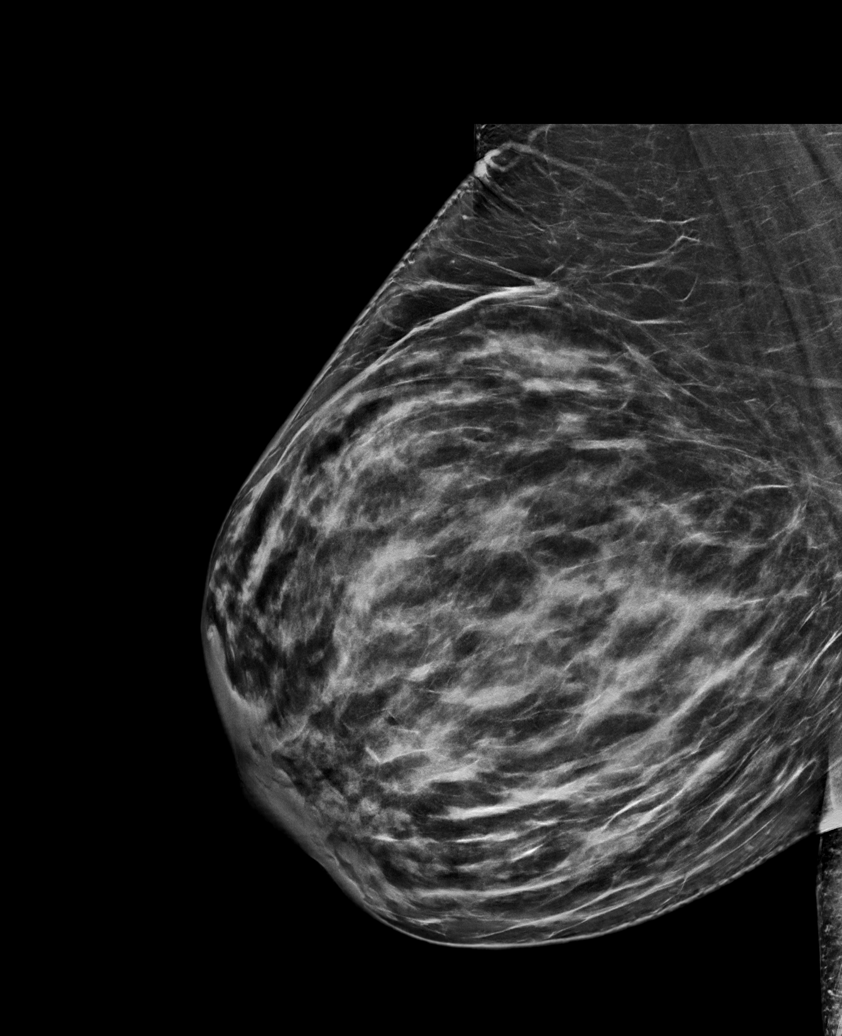

[R CC synth-2D]
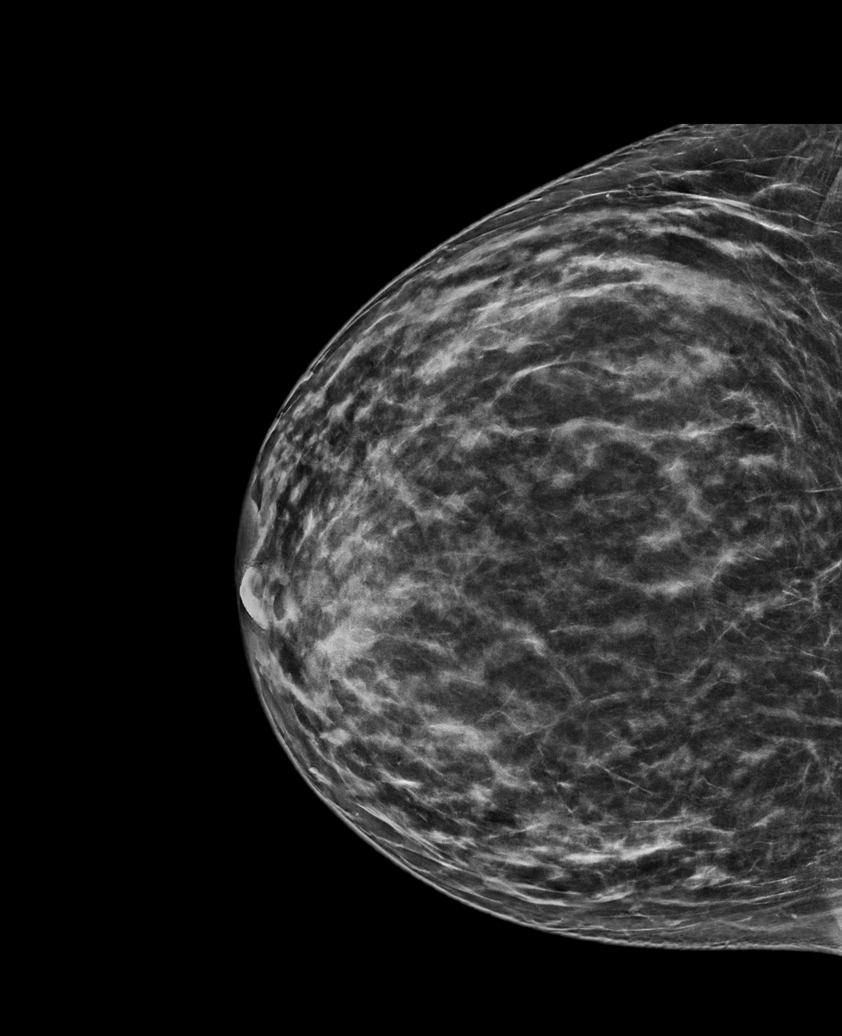

[L CC synth-2D]
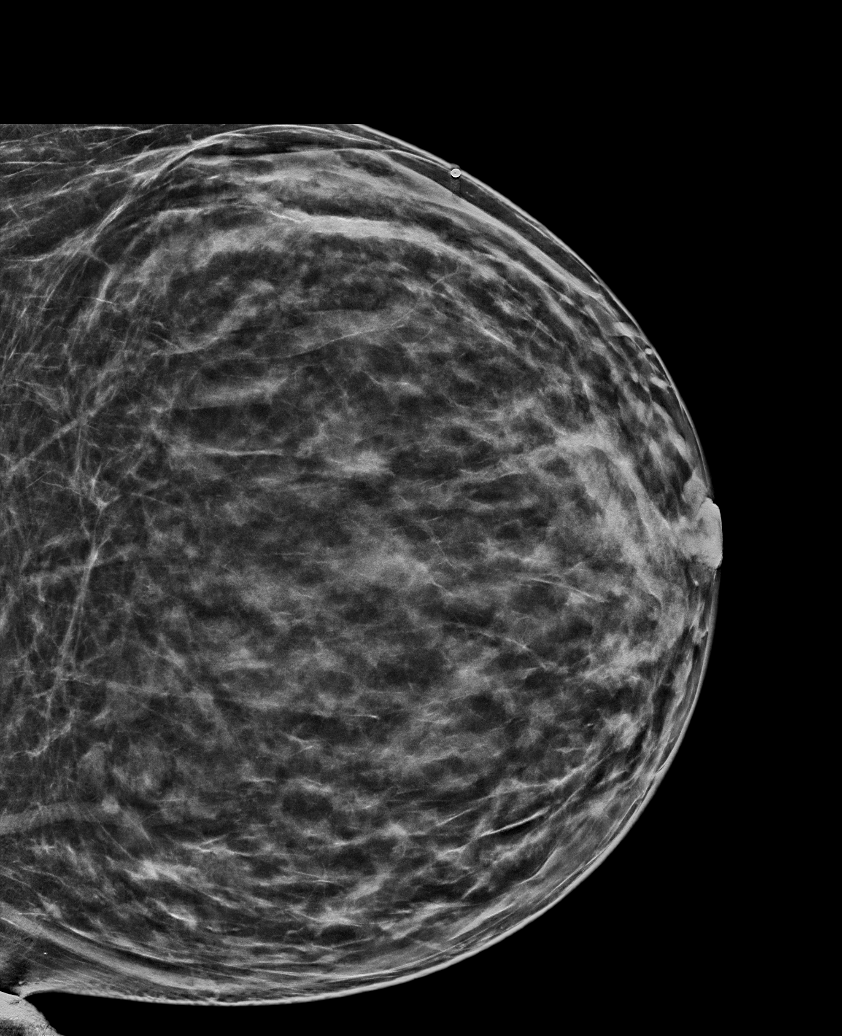

[L TAN synth-2D]
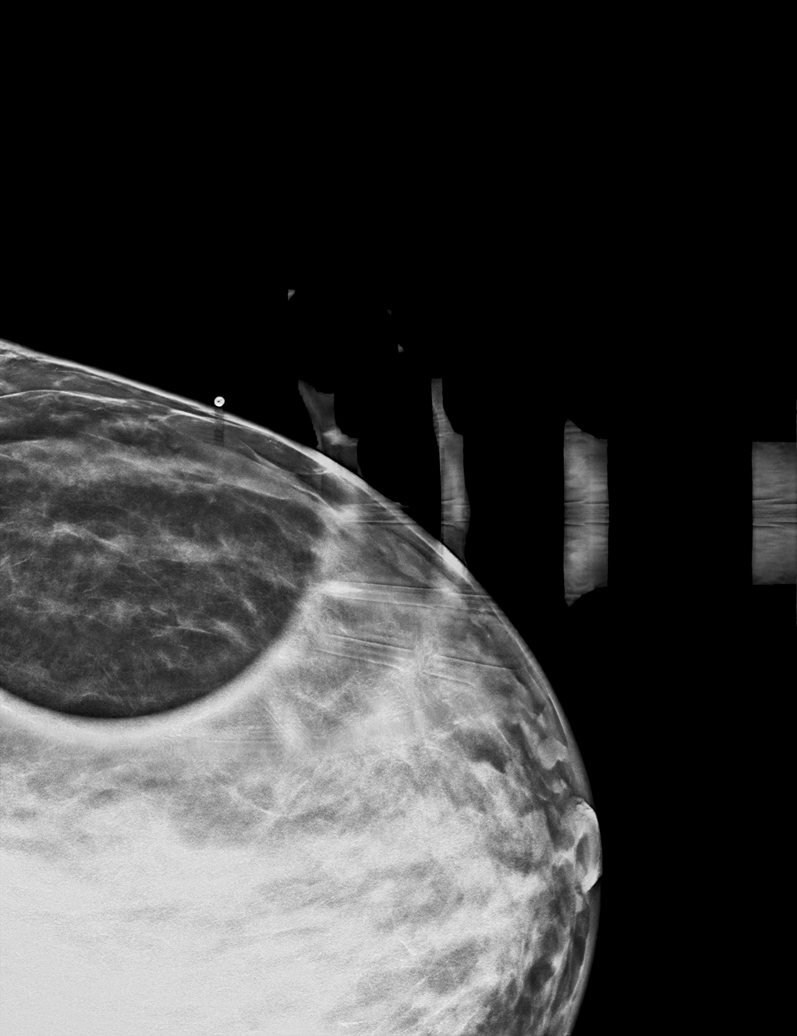

[R MLO tomo · tomo slice 47/92.0]
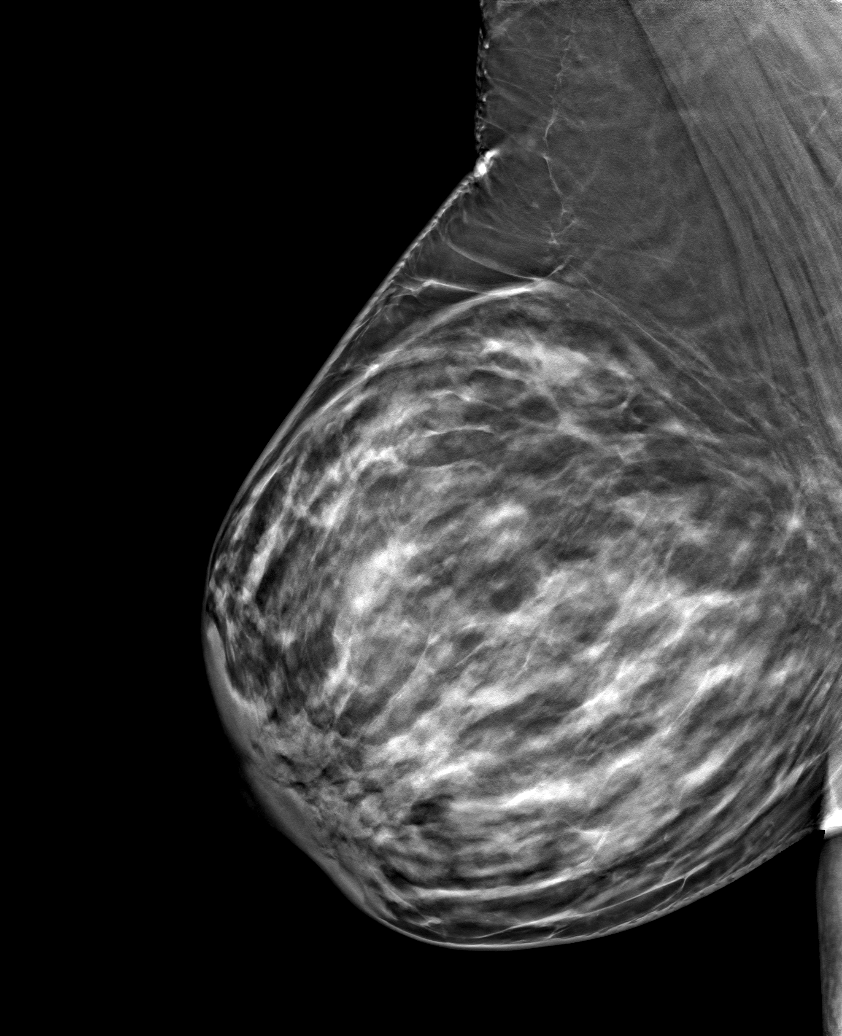

[6 of 30 positions shown; findings below may reference images not displayed]

ACR Breast Density Category c: The breast tissue is heterogeneously
dense, which may obscure small masses.
FINDINGS: The left breast is diffusely larger than the right breast with no
mammographic findings suspicious for malignancy in either breast.

Mammographic images were processed with CAD.

On physical exam, there is mild diffuse palpable soft tissue
thickening in the outer left breast in the area of patient concern.

Targeted ultrasound is performed, showing normal appearing breast
tissue throughout the outer left breast in the area of patient
concern, including intermixed fatty tissue and glandular tissue. No
mass or other findings suspicious for malignancy are seen.
IMPRESSION: No evidence of malignancy.

RECOMMENDATION:
Annual screening mammography beginning at age 40. The patient was
instructed to return sooner if the area that she feels becomes
larger and firmer to palpation.

I have discussed the findings and recommendations with the patient.
If applicable, a reminder letter will be sent to the patient
regarding the next appointment.

BI-RADS CATEGORY  1: Negative.

## 2020-12-08 DIAGNOSIS — K8020 Calculus of gallbladder without cholecystitis without obstruction: Secondary | ICD-10-CM | POA: Insufficient documentation

## 2020-12-08 DIAGNOSIS — K760 Fatty (change of) liver, not elsewhere classified: Secondary | ICD-10-CM

## 2020-12-08 HISTORY — DX: Fatty (change of) liver, not elsewhere classified: K76.0

## 2021-08-27 ENCOUNTER — Emergency Department (INDEPENDENT_AMBULATORY_CARE_PROVIDER_SITE_OTHER): Admission: EM | Admit: 2021-08-27 | Discharge: 2021-08-27 | Payer: BC Managed Care – PPO | Source: Home / Self Care

## 2021-08-27 ENCOUNTER — Other Ambulatory Visit: Payer: Self-pay

## 2021-08-27 DIAGNOSIS — Z751 Person awaiting admission to adequate facility elsewhere: Secondary | ICD-10-CM

## 2021-08-27 DIAGNOSIS — I10 Essential (primary) hypertension: Secondary | ICD-10-CM

## 2021-08-27 DIAGNOSIS — R079 Chest pain, unspecified: Secondary | ICD-10-CM

## 2021-08-27 MED ORDER — ACETAMINOPHEN 325 MG PO TABS
650.0000 mg | ORAL_TABLET | Freq: Once | ORAL | Status: AC
  Administered 2021-08-27: 650 mg via ORAL

## 2021-08-27 NOTE — Discharge Instructions (Signed)
Go to emergency room

## 2021-08-27 NOTE — ED Triage Notes (Signed)
Pt presents with right breast pain and back pain that began today. Pt states she has a history of HTN and monitors her BP at home. Pt came today due to a BP reading of 184/129.

## 2022-02-23 ENCOUNTER — Other Ambulatory Visit: Payer: Self-pay

## 2022-02-23 ENCOUNTER — Emergency Department
Admission: EM | Admit: 2022-02-23 | Discharge: 2022-02-23 | Disposition: A | Discharge status: Home / Self Care | Payer: BC Managed Care – PPO | Source: Home / Self Care

## 2022-02-23 DIAGNOSIS — J3489 Other specified disorders of nose and nasal sinuses: Secondary | ICD-10-CM | POA: Diagnosis not present

## 2022-02-23 DIAGNOSIS — J309 Allergic rhinitis, unspecified: Secondary | ICD-10-CM | POA: Diagnosis not present

## 2022-02-23 MED ORDER — PREDNISONE 20 MG PO TABS: ORAL_TABLET | ORAL | 0 refills | Status: DC

## 2022-02-23 MED ORDER — FEXOFENADINE HCL 180 MG PO TABS: 180.0000 mg | ORAL_TABLET | Freq: Every day | ORAL | 0 refills | Status: DC

## 2022-02-23 NOTE — ED Provider Notes (Signed)
?[facility] ? ? ? ?CSN: [id number] ?Arrival date & time: 02/23/22  1550 ? ? ?  ? ?History   ?Chief Complaint ?Chief Complaint  ?Patient presents with  ? Facial Pain  ? Otalgia  ? ? ?HPI ?Amy Rodgers is a 36 y.o. female.  ? ?HPI 36 year old female presents with sinus pressure and bilateral otalgia.  Reports having similar symptoms on 02/13/2022 was diagnosed with bilateral ear infection and sinus infection prescribed antibiotic.  Patient reports she was treated with Augmentin.  PMH significant for prediabetes, obesity, and IBS.  Patient is accompanied by her Mother today. ? ?Past Medical History:  ?Diagnosis Date  ? Anemia   ? Borderline  ? Anxiety   ? Asthma   ? Complication of anesthesia   ? "freaks out waking up from surgery"  ? Depression   ? GERD (gastroesophageal reflux disease)   ? Gestational diabetes   ? IBS (irritable bowel syndrome)   ? Kidney stones   ? Mononucleosis   ? Pneumonia   ? 10/16  ? PONV (postoperative nausea and vomiting)   ? Pre-diabetes   ? ? ?Patient Active Problem List  ? Diagnosis Date Noted  ? Acute blood loss anemia 06/23/2019  ? Previous cesarean section 06/21/2019  ? PCOS (polycystic ovarian syndrome) 06/21/2019  ? Maternal obesity, antepartum 06/21/2019  ? Postpartum care following cesarean delivery (8/3) 06/21/2019  ? Status post repeat low transverse cesarean section 06/21/2019  ? GDM, class A2 12/27/2016  ? ? ?Past Surgical History:  ?Procedure Laterality Date  ? BACK SURGERY    ? CESAREAN SECTION N/A 12/28/2016  ? Procedure: CESAREAN SECTION;  Surgeon: Genia Del, MD;  Location: [facility] BIRTHING SUITES;  Service: Obstetrics;  Laterality: N/A;  ? CESAREAN SECTION N/A 06/21/2019  ? Procedure: Repeat CESAREAN SECTION;  Surgeon: Shea Evans, MD;  Location: [facility] LD ORS;  Service: Obstetrics;  Laterality: N/A;  EDD: 06/28/19  ? CHROMOPERTUBATION Bilateral 10/26/2015  ? Procedure: CHROMOPERTUBATION;  Surgeon: Shea Evans, MD;  Location: [facility] ORS;  Service: Gynecology;   Laterality: Bilateral;  ? COLONOSCOPY    ? Kidney stent    ? KNEE SURGERY Left   ? LAPAROSCOPY N/A 10/26/2015  ? Procedure: LAPAROSCOPY DIAGNOSTIC ;  Surgeon: Shea Evans, MD;  Location: [facility] ORS;  Service: Gynecology;  Laterality: N/A;  ? TONSILLECTOMY    ? UPPER GASTROINTESTINAL ENDOSCOPY    ? ? ?OB History   ? ? Gravida  ?2  ? Para  ?1  ? Term  ?1  ? Preterm  ?   ? AB  ?   ? Living  ?1  ?  ? ? SAB  ?   ? IAB  ?   ? Ectopic  ?   ? Multiple  ?0  ? Live Births  ?1  ?   ?  ?  ? ? ? ?Home Medications   ? ?Prior to Admission medications   ?Medication Sig Start Date End Date Taking? Authorizing Provider  ?fexofenadine (ALLEGRA ALLERGY) 180 MG tablet Take 1 tablet (180 mg total) by mouth daily for 15 days. 02/23/22 03/10/22 Yes Trevor Iha, FNP  ?predniSONE (DELTASONE) 20 MG tablet Take 3 tabs PO daily x 5 days. 02/23/22  Yes Trevor Iha, FNP  ?losartan (COZAAR) 25 MG tablet Take 25 mg by mouth daily.    [provider]  ?metFORMIN (GLUCOPHAGE) 500 MG tablet Take 1 tablet (500 mg total) by mouth 2 (two) times daily with a meal. 06/23/19   Marlinda Mike, CNM  ? ? ?  Family History ?Family History  ?Problem Relation Age of Onset  ? Hypertension Mother   ? Hypertension Father   ? Heart disease Father   ? Diabetes Father   ? Cancer Maternal Grandmother   ? Breast cancer Maternal Grandmother   ? Cancer Maternal Grandfather   ? Cancer Maternal Aunt   ? ? ?Social History ?Social History  ? ?Tobacco Use  ? Smoking status: Never  ? Smokeless tobacco: Never  ?Vaping Use  ? Vaping Use: Never used  ?Substance Use Topics  ? Alcohol use: No  ? Drug use: No  ? ? ? ?Allergies   ?Codeine ? ? ?Review of Systems ?Review of Systems  ?HENT:  Positive for congestion, sinus pressure and sinus pain.   ?All other systems reviewed and are negative. ? ? ?Physical Exam ?Triage Vital Signs ?ED Triage Vitals  ?Enc Vitals Group  ?   BP 02/23/22 1603 (!) 154/61  ?   Pulse Rate 02/23/22 1603 68  ?   Resp 02/23/22 1603 20  ?   Temp 02/23/22 1603  98.1 ?F (36.7 ?C)  ?   Temp Source 02/23/22 1603 Oral  ?   SpO2 02/23/22 1603 98 %  ?   Weight 02/23/22 1602 251 lb (113.9 kg)  ?   Height 02/23/22 1602 5\' 5"  (1.651 m)  ?   Head Circumference --   ?   Peak Flow --   ?   Pain Score 02/23/22 1602 6  ?   Pain Loc --   ?   Pain Edu? --   ?   Excl. in GC? --   ? ?No data found. ? ?Updated Vital Signs ?BP (!) 154/61 (BP Location: Left Arm)   Pulse 68   Temp 98.1 ?F (36.7 ?C) (Oral)   Resp 20   Ht 5\' 5"  (1.651 m)   Wt 251 lb (113.9 kg)   LMP 02/13/2022 (Exact Date)   SpO2 98%   BMI 41.77 kg/m?  ? ? ?Physical Exam ?Vitals and nursing note reviewed.  ?Constitutional:   ?   General: She is not in acute distress. ?   Appearance: She is obese. She is not ill-appearing, toxic-appearing or diaphoretic.  ?HENT:  ?   Head: Normocephalic and atraumatic.  ?   Right Ear: Tympanic membrane, ear canal and external ear normal.  ?   Left Ear: Tympanic membrane, ear canal and external ear normal.  ?   Nose:  ?   Right Sinus: Maxillary sinus tenderness present.  ?   Left Sinus: Maxillary sinus tenderness present.  ?   Mouth/Throat:  ?   Mouth: Mucous membranes are moist.  ?   Pharynx: Oropharynx is clear.  ?Eyes:  ?   Extraocular Movements: Extraocular movements intact.  ?   Conjunctiva/sclera: Conjunctivae normal.  ?   Pupils: Pupils are equal, round, and reactive to light.  ?Cardiovascular:  ?   Rate and Rhythm: Normal rate and regular rhythm.  ?   Pulses: Normal pulses.  ?   Heart sounds: Normal heart sounds.  ?Pulmonary:  ?   Effort: Pulmonary effort is normal.  ?   Breath sounds: Normal breath sounds. No wheezing, rhonchi or rales.  ?Musculoskeletal:  ?   Cervical back: Normal range of motion and neck supple.  ?Skin: ?   General: Skin is warm and dry.  ?Neurological:  ?   General: No focal deficit present.  ?   Mental Status: She is alert and oriented to person, place, and  time.  ? ? ? ?UC Treatments / Results  ?Labs ?(all labs ordered are listed, but only abnormal results are  displayed) ?Labs Reviewed - No data to display ? ?EKG ? ? ?Radiology ?No results found. ? ?Procedures ?Procedures (including critical care time) ? ?Medications Ordered in UC ?Medications - No data to display ? ?Initial Impression / Assessment and Plan / UC Course  ?I have reviewed the triage vital signs and the nursing notes. ? ?Pertinent labs & imaging results that were available during my care of the patient were reviewed by me and considered in my medical decision making (see chart for details). ? ?  ? ?MDM: 1.  Sinus pressure-Rx'd Prednisone; 2.  Allergic rhinitis-Rx'd Allegra. Advised patient to take medication as directed with food to completion.  Advised patient to take Allegra with prednisone for the next 5 days.  Advised may take prednisone as needed afterwards for concurrent postnasal drip/drainage.  Encouraged patient to increase daily water intake while taking this medication.  Advised if symptoms worsen and/or unresolved please follow-up with PCP or here for further evaluation.  Patient discharged home, hemodynamically stable. ? ?Final Clinical Impressions(s) / UC Diagnoses  ? ?Final diagnoses:  ?Sinus pressure  ?Allergic rhinitis, unspecified seasonality, unspecified trigger  ? ? ? ?Discharge Instructions   ? ?  ?Advised patient to take medication as directed with food to completion.  Advised patient to take Allegra with prednisone for the next 5 days.  Advised may take prednisone as needed afterwards for concurrent postnasal drip/drainage.  Encouraged patient to increase daily water intake while taking this medication.  Advised if symptoms worsen and/or unresolved please follow-up with PCP or here for further evaluation. ? ? ? ? ?ED Prescriptions   ? ? Medication Sig Dispense Auth. Provider  ? predniSONE (DELTASONE) 20 MG tablet Take 3 tabs PO daily x 5 days. 15 tablet Trevor Ihaagan, Maliha Outten, FNP  ? fexofenadine (ALLEGRA ALLERGY) 180 MG tablet Take 1 tablet (180 mg total) by mouth daily for 15 days. 15 tablet  Trevor Ihaagan, Garey Alleva, FNP  ? ?  ? ?PDMP not reviewed this encounter. ?  ?Trevor IhaRagan, Llewellyn Schoenberger, FNP ?02/23/22 1644 ? ?

## 2022-02-23 NOTE — Discharge Instructions (Addendum)
Advised patient to take medication as directed with food to completion.  Advised patient to take Allegra with prednisone for the next 5 days.  Advised may take prednisone as needed afterwards for concurrent postnasal drip/drainage.  Encouraged patient to increase daily water intake while taking this medication.  Advised if symptoms worsen and/or unresolved please follow-up with PCP or here for further evaluation. ?

## 2022-02-23 NOTE — ED Triage Notes (Signed)
Pt presents to Urgent Care with c/o sinus pressure and bilateral otalgia. Reports having s/s on 02/13/22 and was dx w/ bilateral ear infections and sinus infection, prescribed antibiotic. States she was feeling better up until a few days ago.  ?

## 2022-06-12 DIAGNOSIS — L650 Telogen effluvium: Secondary | ICD-10-CM | POA: Insufficient documentation

## 2023-02-03 ENCOUNTER — Inpatient Hospital Stay (HOSPITAL_COMMUNITY)
Admission: AD | Admit: 2023-02-03 | Discharge: 2023-02-04 | Disposition: A | Discharge status: Home / Self Care | Payer: BC Managed Care – PPO | Attending: Obstetrics & Gynecology | Admitting: Obstetrics & Gynecology

## 2023-02-03 DIAGNOSIS — O3481 Maternal care for other abnormalities of pelvic organs, first trimester: Secondary | ICD-10-CM | POA: Insufficient documentation

## 2023-02-03 DIAGNOSIS — N83202 Unspecified ovarian cyst, left side: Secondary | ICD-10-CM | POA: Insufficient documentation

## 2023-02-03 DIAGNOSIS — O00201 Right ovarian pregnancy without intrauterine pregnancy: Secondary | ICD-10-CM

## 2023-02-04 ENCOUNTER — Encounter (HOSPITAL_COMMUNITY): Payer: Self-pay | Admitting: Obstetrics & Gynecology

## 2023-02-04 ENCOUNTER — Inpatient Hospital Stay (HOSPITAL_COMMUNITY): Payer: BC Managed Care – PPO

## 2023-02-04 DIAGNOSIS — Z3A10 10 weeks gestation of pregnancy: Secondary | ICD-10-CM

## 2023-02-04 DIAGNOSIS — O3481 Maternal care for other abnormalities of pelvic organs, first trimester: Secondary | ICD-10-CM | POA: Diagnosis not present

## 2023-02-04 DIAGNOSIS — N83202 Unspecified ovarian cyst, left side: Secondary | ICD-10-CM | POA: Diagnosis not present

## 2023-02-04 DIAGNOSIS — O00201 Right ovarian pregnancy without intrauterine pregnancy: Secondary | ICD-10-CM

## 2023-02-04 DIAGNOSIS — R102 Pelvic and perineal pain: Secondary | ICD-10-CM | POA: Diagnosis present

## 2023-02-04 LAB — CBC
HCT: 35 % — ABNORMAL LOW (ref 36.0–46.0)
Hemoglobin: 11.7 g/dL — ABNORMAL LOW (ref 12.0–15.0)
MCH: 28.3 pg (ref 26.0–34.0)
MCHC: 33.4 g/dL (ref 30.0–36.0)
MCV: 84.7 fL (ref 80.0–100.0)
Platelets: 295 10*3/uL (ref 150–400)
RBC: 4.13 MIL/uL (ref 3.87–5.11)
RDW: 14.1 % (ref 11.5–15.5)
WBC: 9.8 10*3/uL (ref 4.0–10.5)
nRBC: 0 % (ref 0.0–0.2)

## 2023-02-04 LAB — COMPREHENSIVE METABOLIC PANEL
ALT: 25 U/L (ref 0–44)
AST: 19 U/L (ref 15–41)
Albumin: 3.9 g/dL (ref 3.5–5.0)
Alkaline Phosphatase: 51 U/L (ref 38–126)
Anion gap: 9 (ref 5–15)
BUN: 7 mg/dL (ref 6–20)
CO2: 22 mmol/L (ref 22–32)
Calcium: 9.4 mg/dL (ref 8.9–10.3)
Chloride: 104 mmol/L (ref 98–111)
Creatinine, Ser: 0.66 mg/dL (ref 0.44–1.00)
GFR, Estimated: >60 mL/min (ref >60)
Glucose, Bld: 121 mg/dL — ABNORMAL HIGH (ref 70–99)
Potassium: 4 mmol/L (ref 3.5–5.1)
Sodium: 135 mmol/L (ref 135–145)
Total Bilirubin: 0.5 mg/dL (ref 0.3–1.2)
Total Protein: 7.5 g/dL (ref 6.5–8.1)

## 2023-02-04 LAB — WET PREP, GENITAL
Clue Cells Wet Prep HPF POC: NONE SEEN
Sperm: NONE SEEN
Trich, Wet Prep: NONE SEEN
WBC, Wet Prep HPF POC: <10 (ref <10)

## 2023-02-04 LAB — URINALYSIS, ROUTINE W REFLEX MICROSCOPIC
Bacteria, UA: NONE SEEN
Bilirubin Urine: NEGATIVE
Glucose, UA: NEGATIVE mg/dL
Ketones, ur: NEGATIVE mg/dL
Leukocytes,Ua: NEGATIVE
Nitrite: NEGATIVE
Protein, ur: NEGATIVE mg/dL
Specific Gravity, Urine: 1.013 (ref 1.005–1.030)
pH: 5 (ref 5.0–8.0)

## 2023-02-04 LAB — GC/CHLAMYDIA PROBE AMP (~~LOC~~) NOT AT ARMC
Chlamydia: NEGATIVE
Comment: NEGATIVE
Comment: NORMAL
Neisseria Gonorrhea: NEGATIVE

## 2023-02-04 LAB — HCG, QUANTITATIVE, PREGNANCY: hCG, Beta Chain, Quant, S: 235 m[IU]/mL — ABNORMAL HIGH (ref <5)

## 2023-02-04 MED ORDER — METHOTREXATE FOR ECTOPIC PREGNANCY
50.0000 mg/m2 | Freq: Once | INTRAMUSCULAR | Status: AC
  Administered 2023-02-04: 115 mg via INTRAMUSCULAR
  Filled 2023-02-04: qty 4.6

## 2023-02-04 MED ORDER — ACETAMINOPHEN 500 MG PO TABS
1000.0000 mg | ORAL_TABLET | Freq: Once | ORAL | Status: AC
  Administered 2023-02-04: 1000 mg via ORAL
  Filled 2023-02-04: qty 2

## 2023-02-04 NOTE — MAU Provider Note (Signed)
History     CSN: [id number]  Arrival date and time: 02/03/23 2323   None     Chief Complaint  Patient presents with   Abdominal Pain   Vaginal Bleeding   HPI Amy Rodgers is a 37 y.o. G3P1001 at [redacted]w[redacted]d by LMP who presents to MAU for lower abdominal pain that radiates into low back and vagina. Pain started today but has been feeling "uncomfortable and bloated" for the past several days. She has notice a lot of "ligament pain" for the past few days as well. Pain is constant. She has been taking 500mg  of Tylenol without relief. She reports she also started to have vaginal spotting with wiping today. She reports pain feels similar to previous experience with kidney stone. No urinary s/s other than frequency. She was seen at [facility] on Friday for an HCG which was 179. She was seen again today for a repeat HCG however does not have those results back.    OB History     Gravida  3   Para  1   Term  1   Preterm      AB  0   Living  2      SAB  0   IAB  0   Ectopic  0   Multiple  0   Live Births  2           Past Medical History:  Diagnosis Date   Anemia    Borderline   Anxiety    Asthma    Complication of anesthesia    "freaks out waking up from surgery"   Depression    GERD (gastroesophageal reflux disease)    Gestational diabetes    IBS (irritable bowel syndrome)    Kidney stones    Mononucleosis    Pneumonia    10/16   PONV (postoperative nausea and vomiting)    Pre-diabetes     Past Surgical History:  Procedure Laterality Date   BACK SURGERY     CESAREAN SECTION N/A 12/28/2016   Procedure: CESAREAN SECTION;  Surgeon: Princess Bruins, MD;  Location: [place];  Service: Obstetrics;  Laterality: N/A;   CESAREAN SECTION N/A 06/21/2019   Procedure: Repeat CESAREAN SECTION;  Surgeon: Azucena Fallen, MD;  Location: [place] [facility];  Service: Obstetrics;  Laterality: N/A;  EDD: 06/28/19   CHROMOPERTUBATION Bilateral 10/26/2015   Procedure:  CHROMOPERTUBATION;  Surgeon: Azucena Fallen, MD;  Location: [place] [facility];  Service: Gynecology;  Laterality: Bilateral;   COLONOSCOPY     Kidney stent     KNEE SURGERY Left    LAPAROSCOPY N/A 10/26/2015   Procedure: LAPAROSCOPY DIAGNOSTIC ;  Surgeon: Azucena Fallen, MD;  Location: [place] [facility];  Service: Gynecology;  Laterality: N/A;   TONSILLECTOMY     UPPER GASTROINTESTINAL ENDOSCOPY      Family History  Problem Relation Age of Onset   Hypertension Mother    Hypertension Father    Heart disease Father    Diabetes Father    Cancer Maternal Grandmother    Breast cancer Maternal Grandmother    Cancer Maternal Grandfather    Cancer Maternal Aunt     Social History   Tobacco Use   Smoking status: Never   Smokeless tobacco: Never  Vaping Use   Vaping Use: Never used  Substance Use Topics   Alcohol use: No   Drug use: No    Allergies:  Allergies  Allergen Reactions   Codeine Other (See Comments)  Headaches, itching    Medications Prior to Admission  Medication Sig Dispense Refill Last Dose   Prenatal Vit-Fe Fumarate-FA (PRENATAL MULTIVITAMIN) TABS tablet Take 1 tablet by mouth daily at 12 noon.   02/03/2023   progesterone (PROMETRIUM) 100 MG capsule Take 100 mg by mouth daily.   02/03/2023   fexofenadine (ALLEGRA ALLERGY) 180 MG tablet Take 1 tablet (180 mg total) by mouth daily for 15 days. 15 tablet 0    losartan (COZAAR) 25 MG tablet Take 25 mg by mouth daily.   stopped on 01-30-2023   metFORMIN (GLUCOPHAGE) 500 MG tablet Take 1 tablet (500 mg total) by mouth 2 (two) times daily with a meal. 60 tablet 0 More than a month   predniSONE (DELTASONE) 20 MG tablet Take 3 tabs PO daily x 5 days. 15 tablet 0 More than a month   Review of Systems  Gastrointestinal:  Positive for abdominal pain.  Genitourinary:  Positive for vaginal bleeding (spotting).  Musculoskeletal:  Positive for back pain.  All other systems reviewed and are negative.  Physical Exam   Blood pressure (!) 151/99,  pulse 83, temperature 98 F (36.7 C), temperature source Oral, resp. rate 16, height 5\' 5"  (1.651 m), weight 114.1 kg, last menstrual period 11/20/2022.  Physical Exam Vitals and nursing note reviewed.  Constitutional:      General: She is not in acute distress.    Appearance: She is obese.  Cardiovascular:     Rate and Rhythm: Normal rate.  Pulmonary:     Effort: Pulmonary effort is normal. No respiratory distress.  Abdominal:     Palpations: Abdomen is soft.     Tenderness: There is no abdominal tenderness.  Genitourinary:    Comments: Patient self swabbed Skin:    General: Skin is warm and dry.  Neurological:     General: No focal deficit present.     Mental Status: She is alert and oriented to person, place, and time.  Psychiatric:        Mood and Affect: Mood normal.        Behavior: Behavior normal.    Results for orders placed or performed during the hospital encounter of 02/03/23 (from the past 24 hour(s))  Wet prep, genital     Status: Abnormal   Collection Time: 02/04/23 12:28 AM  Result Value Ref Range   Yeast Wet Prep HPF POC PRESENT (A) NONE SEEN   Trich, Wet Prep NONE SEEN NONE SEEN   Clue Cells Wet Prep HPF POC NONE SEEN NONE SEEN   WBC, Wet Prep HPF POC <10 <10   Sperm NONE SEEN   Urinalysis, Routine w reflex microscopic -Urine, Clean Catch     Status: Abnormal   Collection Time: 02/04/23 12:33 AM  Result Value Ref Range   Color, Urine YELLOW YELLOW   APPearance CLEAR CLEAR   Specific Gravity, Urine 1.013 1.005 - 1.030   pH 5.0 5.0 - 8.0   Glucose, UA NEGATIVE NEGATIVE mg/dL   Hgb urine dipstick SMALL (A) NEGATIVE   Bilirubin Urine NEGATIVE NEGATIVE   Ketones, ur NEGATIVE NEGATIVE mg/dL   Protein, ur NEGATIVE NEGATIVE mg/dL   Nitrite NEGATIVE NEGATIVE   Leukocytes,Ua NEGATIVE NEGATIVE   RBC / HPF 0-5 0 - 5 RBC/hpf   WBC, UA 0-5 0 - 5 WBC/hpf   Bacteria, UA NONE SEEN NONE SEEN   Squamous Epithelial / HPF 0-5 0 - 5 /HPF  CBC     Status: Abnormal    Collection Time: 02/04/23  1:30 AM  Result Value Ref Range   WBC 9.8 4.0 - 10.5 K/uL   RBC 4.13 3.87 - 5.11 MIL/uL   Hemoglobin 11.7 (L) 12.0 - 15.0 g/dL   HCT 35.0 (L) 36.0 - 46.0 %   MCV 84.7 80.0 - 100.0 fL   MCH 28.3 26.0 - 34.0 pg   MCHC 33.4 30.0 - 36.0 g/dL   RDW 14.1 11.5 - 15.5 %   Platelets 295 150 - 400 K/uL   nRBC 0.0 0.0 - 0.2 %  hCG, quantitative, pregnancy     Status: Abnormal   Collection Time: 02/04/23  1:30 AM  Result Value Ref Range   hCG, Beta Chain, Quant, S 235 (H) <5 mIU/mL  Comprehensive metabolic panel     Status: Abnormal   Collection Time: 02/04/23  1:30 AM  Result Value Ref Range   Sodium 135 135 - 145 mmol/L   Potassium 4.0 3.5 - 5.1 mmol/L   Chloride 104 98 - 111 mmol/L   CO2 22 22 - 32 mmol/L   Glucose, Bld 121 (H) 70 - 99 mg/dL   BUN 7 6 - 20 mg/dL   Creatinine, Ser 0.66 0.44 - 1.00 mg/dL   Calcium 9.4 8.9 - 10.3 mg/dL   Total Protein 7.5 6.5 - 8.1 g/dL   Albumin 3.9 3.5 - 5.0 g/dL   AST 19 15 - 41 U/L   ALT 25 0 - 44 U/L   Alkaline Phosphatase 51 38 - 126 U/L   Total Bilirubin 0.5 0.3 - 1.2 mg/dL   GFR, Estimated >60 >60 mL/min   Anion gap 9 5 - 15   US OB LESS THAN 14 WEEKS WITH OB TRANSVAGINAL  Result Date: 02/04/2023 CLINICAL DATA:  Right pelvic pain. Vaginal bleeding. LMP: 11/20/2022. Estimated gestational age of ten weeks 6 days. EXAM: OBSTETRIC <14 WK [place] AND TRANSVAGINAL OB US TECHNIQUE: Both transabdominal and transvaginal ultrasound examinations were performed for complete evaluation of the gestation as well as the maternal uterus, adnexal regions, and pelvic cul-de-sac. Transvaginal technique was performed to assess early pregnancy. COMPARISON:  None Available. FINDINGS: Evaluation is limited due to body habitus. The uterus is anteverted and appears unremarkable. A C-section scars seen. The endometrium is suboptimally visualized. No intrauterine pregnancy noted. The right ovary is unremarkable. There is a 1.8 x 1.6 x 1.7 cm ill-defined  rounded structure medial to the right ovary which is suboptimally evaluated. This structure however appears to be separate from the ovary. In the absence of intrauterine pregnancy and history of right pelvic pain, findings concerning for a right adnexa ectopic pregnancy. There is a 3.8 x 3.2 x 3.3 cm complex/hemorrhagic cyst in the left ovary. No free fluid within the pelvis. IMPRESSION: No intrauterine pregnancy identified. Findings concerning for a right adnexal ectopic pregnancy. Clinical correlation and obstetrical consult is advised. These results will be called to the ordering clinician or representative by the Radiologist Assistant, and communication documented in the PACS or [employer]. Electronically Signed   By: Anner Crete M.D.   On: 02/04/2023 01:32    MAU Course  Procedures  MDM UA, culture CBC, CMP, HCG Wet prep, GC/CT [place] Methotrexate  Labs reassuring. HCG 235. Blood type O positive, Rhogam not indicated. US shows right ectopic pregnancy, no free fluid. Dr. Benjie Karvonen notified of patient arrival and findings. Patient offered and given Methotrexate. Patient to follow up in office on day 4 for bHCG. Dr. Benjie Karvonen to arrange.   Assessment and Plan   1. Ectopic pregnancy  of right ovary    - Discharge home in stable condition - Strict return precautions given. Return to MAU as needed for new/worsening symptoms - F/u in office on Friday 3/22 for Day 4 bHCG   Renee Harder, CNM 02/04/2023, 3:56 AM

## 2023-02-04 NOTE — MAU Note (Addendum)
Pt says she has been going to Dr Gardiner Coins office -  Is 4-5 wks  HCG - 179 on 3-14 Feels sharp pain - started today - in middle of abd  middle of lower back.   Took XS Tyl at 930pm 2 tabs  VB started today at 1 pm-  when she wipes  No U/S  Also feels constipated  Takes BP med but doesn't know name

## 2023-02-05 LAB — CULTURE, OB URINE

## 2023-02-07 DIAGNOSIS — K219 Gastro-esophageal reflux disease without esophagitis: Secondary | ICD-10-CM | POA: Insufficient documentation

## 2023-02-07 HISTORY — DX: Gastro-esophageal reflux disease without esophagitis: K21.9

## 2023-03-17 DIAGNOSIS — M940 Chondrocostal junction syndrome [Tietze]: Secondary | ICD-10-CM | POA: Insufficient documentation

## 2023-08-21 ENCOUNTER — Encounter: Payer: Self-pay | Admitting: Family Medicine

## 2023-08-21 ENCOUNTER — Ambulatory Visit: Payer: BC Managed Care – PPO | Admitting: Family Medicine

## 2023-08-21 VITALS — BP 146/104 | HR 101 | Temp 97.9°F | Resp 18 | Ht 65.0 in | Wt 250.3 lb

## 2023-08-21 DIAGNOSIS — I10 Essential (primary) hypertension: Secondary | ICD-10-CM | POA: Diagnosis not present

## 2023-08-21 DIAGNOSIS — R7302 Impaired glucose tolerance (oral): Secondary | ICD-10-CM | POA: Diagnosis not present

## 2023-08-21 DIAGNOSIS — R399 Unspecified symptoms and signs involving the genitourinary system: Secondary | ICD-10-CM | POA: Diagnosis not present

## 2023-08-21 DIAGNOSIS — Z7689 Persons encountering health services in other specified circumstances: Secondary | ICD-10-CM

## 2023-08-21 DIAGNOSIS — F418 Other specified anxiety disorders: Secondary | ICD-10-CM | POA: Diagnosis not present

## 2023-08-21 LAB — POCT URINALYSIS DIP (CLINITEK)
Bilirubin, UA: NEGATIVE
Blood, UA: NEGATIVE
Glucose, UA: NEGATIVE mg/dL
Ketones, POC UA: NEGATIVE mg/dL
Leukocytes, UA: NEGATIVE
Nitrite, UA: NEGATIVE
Spec Grav, UA: >=1.03 — AB (ref 1.010–1.025)
Urobilinogen, UA: 0.2 U/dL
pH, UA: 5 (ref 5.0–8.0)

## 2023-08-21 MED ORDER — CIPROFLOXACIN HCL 250 MG PO TABS: 250.0000 mg | ORAL_TABLET | Freq: Two times a day (BID) | ORAL | 0 refills | Status: AC

## 2023-08-21 NOTE — Progress Notes (Signed)
New Patient Office Visit  Subjective    Patient ID: Amy Rodgers, female    DOB: November 17, 1986  Age: 37 y.o. MRN: [id number]  CC:  Chief Complaint  Patient presents with   Establish Care    Patient is here to establish care with a new PCP, she is here today due to UTI symptoms that started on "Sunday she started having the urgency and frequency, and some lower abdominal discomfort , and lower back    HPI Amy Rodgers presents to establish care. She goes to [facility] for pap smear.   UTI symptoms Pt reports since Sunday, she's had urinary urgency and vaginal irritation. She's also had some dysuria when urinating along with frequency. She did Azo since then and it did help her symptoms. She reported she stopped taking it one day and her symptoms returned. No hematuria. Pt reports a hx of prediabetes.  Stomach cramps She does have hx of stomach cramps and taking Bentyl. She says she started having this after cholecystectomy in July 19th. She says the Bentyl helped some but not resolved. They then added Colestid at night.   HTN Pt has hx of HTN and taking Losartan 50mg at night. She has been on this for the last 3 years and has had it increased from 25mg to 50mg in the last year.   Depression/anxiety She reports she has 6 and 37 y.o. at home. With her youngest child, she had postpartum depression. She was taking medicine at that time but reported it made her feel numb. She came off of the medicine but reports in the last year, she's more depressed.  She also is having increased panic attacks. She says she thinks it's a lot of change. Her grandfather passed so she moved her GM down here. She has sold her home and has moved in with her mother now. She is awaiting her home to be built. She also has an autistic child. Her and her husband is also in counseling. Her 6 y.o has learning disability/anxiety who is also in counseling/therapy. Flowsheet Row Office Visit from 08/21/2023 in [place]  [facility] [place]  PHQ-9 Total Score 12          10" /01/2023    2:38 PM  GAD 7 : Generalized Anxiety Score  Nervous, Anxious, on Edge 1  Control/stop worrying 1  Worry too much - different things 1  Trouble relaxing 1  Restless 0  Easily annoyed or irritable 3  Afraid - awful might happen 1  Total GAD 7 Score 8  Anxiety Difficulty Somewhat difficult      Outpatient Encounter Medications as of 08/21/2023  Medication Sig   albuterol (VENTOLIN HFA) 108 (90 Base) MCG/ACT inhaler Inhale into the lungs. (Patient not taking: Reported on 08/21/2023)   colestipol (COLESTID) 1 g tablet Take 1 g by mouth daily. (Patient not taking: Reported on 08/21/2023)   dicyclomine (BENTYL) 10 MG capsule Take 10 mg by mouth 4 (four) times daily as needed. (Patient not taking: Reported on 08/21/2023)   [DISCONTINUED] fexofenadine (ALLEGRA ALLERGY) 180 MG tablet Take 1 tablet (180 mg total) by mouth daily for 15 days.   [DISCONTINUED] metFORMIN (GLUCOPHAGE) 500 MG tablet Take 1 tablet (500 mg total) by mouth 2 (two) times daily with a meal.   No facility-administered encounter medications on file as of 08/21/2023.    Past Medical History:  Diagnosis Date   Anemia    Borderline   Anxiety  Asthma    Complication of anesthesia    "freaks out waking up from surgery"   Depression    GERD (gastroesophageal reflux disease)    Gestational diabetes    IBS (irritable bowel syndrome)    Kidney stones    Mononucleosis    Pneumonia    10/16   PONV (postoperative nausea and vomiting)    Pre-diabetes     Past Surgical History:  Procedure Laterality Date   BACK SURGERY     CESAREAN SECTION N/A 12/28/2016   Procedure: CESAREAN SECTION;  Surgeon: Genia Del, MD;  [facility]: [facility];  Service: Obstetrics;  Laterality: N/A;   CESAREAN SECTION N/A 06/21/2019   Procedure: Repeat CESAREAN SECTION;  Surgeon: Shea Evans, MD;  Location: [facility];  Service: Obstetrics;  Laterality:  N/A;  EDD: 06/28/19   CHROMOPERTUBATION Bilateral 10/26/2015   Procedure: CHROMOPERTUBATION;  Surgeon: Shea Evans, MD;  Location: [facility];  Service: Gynecology;  Laterality: Bilateral;   COLONOSCOPY     Kidney stent     KNEE SURGERY Left    LAPAROSCOPY N/A 10/26/2015   Procedure: LAPAROSCOPY DIAGNOSTIC ;  Surgeon: Shea Evans, MD;  Location: [facility];  Service: Gynecology;  Laterality: N/A;   TONSILLECTOMY     UPPER GASTROINTESTINAL ENDOSCOPY      Family History  Problem Relation Age of Onset   Hypertension Mother    Hypertension Father    Heart disease Father    Diabetes Father    Cancer Maternal Grandmother    Breast cancer Maternal Grandmother    Cancer Maternal Grandfather    Cancer Maternal Aunt     Social History   Socioeconomic History   Marital status: Married    Spouse name: Not on file   Number of children: Not on file   Years of education: Not on file   Highest education level: Not on file  Occupational History   Not on file  Tobacco Use   Smoking status: Never   Smokeless tobacco: Never  Vaping Use   Vaping status: Never Used  Substance and Sexual Activity   Alcohol use: No   Drug use: No   Sexual activity: Yes  Other Topics Concern   Not on file  Social History Narrative   Not on file   Social Determinants of Health   Financial Resource Strain: Patient Declined (04/22/2023)   Received from [facility]   Overall Financial Resource Strain (CARDIA)    Difficulty of Paying Living Expenses: Patient declined  Food Insecurity: Patient Declined (04/22/2023)   [facility]   Hunger Vital Sign    Worried About Running Out of Food in the Last Year: Patient declined    Ran Out of Food in the Last Year: Patient declined  Transportation Needs: Patient Declined (04/22/2023)   Received from [facility] - Transportation    Lack of Transportation (Medical): Patient declined    Lack of Transportation (Non-Medical): Patient declined   Physical Activity: Unknown (04/22/2023)   Received from [facility]   Exercise Vital Sign    Days of Exercise per Week: Patient declined    Minutes of Exercise per Session: Not on file  Stress: No Stress Concern Present (06/06/2023)   Received from [facility] - Occupational Stress Questionnaire    Feeling of Stress : Not at all  Social Connections: Unknown (03/20/2022)   Received from [facility]   Social Network  Social Network: Not on file  Intimate Partner Violence: Not At Risk (07/05/2023)   Received from [facility]   HITS    Over the last 12 months how often did your partner physically hurt you?: 1    Over the last 12 months how often did your partner insult you or talk down to you?: 1    Over the last 12 months how often did your partner threaten you with physical harm?: 1    Over the last 12 months how often did your partner scream or curse at you?: 1    Review of Systems  Genitourinary:  Positive for frequency and urgency. Negative for dysuria, flank pain and hematuria.  Psychiatric/Behavioral:  Positive for depression. The patient is nervous/anxious.   All other systems reviewed and are negative.      Objective    BP (!) 146/104   Pulse (!) 101   Temp 97.9 F (36.6 C) (Oral)   Resp 18   Ht 5\' 5"  (1.651 m)   Wt 250 lb 4.8 oz (113.5 kg)   LMP 11/20/2022   SpO2 96%   Breastfeeding Unknown   BMI 41.65 kg/m   Physical Exam Vitals and nursing note reviewed.  Constitutional:      Appearance: Normal appearance. She is obese.  HENT:     Head: Normocephalic and atraumatic.     Right Ear: External ear normal.     Left Ear: External ear normal.     Nose: Nose normal.     Mouth/Throat:     Mouth: Mucous membranes are moist.     Pharynx: Oropharynx is clear.  Eyes:     Conjunctiva/sclera: Conjunctivae normal.     Pupils: Pupils are equal, round, and reactive to light.  Cardiovascular:     Rate  and Rhythm: Normal rate and regular rhythm.     Pulses: Normal pulses.     Heart sounds: Normal heart sounds.  Pulmonary:     Effort: Pulmonary effort is normal.     Breath sounds: Normal breath sounds.  Abdominal:     General: Abdomen is flat. Bowel sounds are normal.  Skin:    General: Skin is warm.     Capillary Refill: Capillary refill takes less than 2 seconds.  Neurological:     General: No focal deficit present.     Mental Status: She is alert and oriented to person, place, and time. Mental status is at baseline.  Psychiatric:        Mood and Affect: Mood normal.        Behavior: Behavior normal.        Thought Content: Thought content normal.        Judgment: Judgment normal.       Assessment & Plan:   Problem List Items Addressed This Visit   None Visit Diagnoses     UTI symptoms    -  Primary   Relevant Orders   POCT URINALYSIS DIP (CLINITEK)     Encounter to establish care with new doctor  UTI symptoms -     POCT URINALYSIS DIP (CLINITEK) -     Urine Culture -     Ciprofloxacin HCl; Take 1 tablet (250 mg total) by mouth 2 (two) times daily for 3 days.  Dispense: 6 tablet; Refill: 0  Primary hypertension -     Basic metabolic panel  Depression with anxiety  Impaired glucose tolerance -     Hemoglobin A1c   Urine dip negative but  could be due to taking Azo. Will send for culture and empirically treat with Cipro 250mg  BID x 3 days. Pending results, will follow up if no better. Due to prediabetes, will screen A1c due to urinary frequency. Pt's blood pressure uncontrolled. She reports taking her Losartan 50mg  at night. Advised to take during the day and will monitor, with recheck in 4 weeks. BMP check today.  For depression/anxiety, continue counseling. Pt defers treatment at this time.  No follow-ups on file.   Suzan Slick, MD

## 2023-08-22 LAB — BASIC METABOLIC PANEL
BUN/Creatinine Ratio: 13 (ref 9–23)
BUN: 9 mg/dL (ref 6–20)
CO2: 23 mmol/L (ref 20–29)
Calcium: 9.5 mg/dL (ref 8.7–10.2)
Chloride: 101 mmol/L (ref 96–106)
Creatinine, Ser: 0.68 mg/dL (ref 0.57–1.00)
Glucose: 127 mg/dL — ABNORMAL HIGH (ref 70–99)
Potassium: 4.2 mmol/L (ref 3.5–5.2)
Sodium: 140 mmol/L (ref 134–144)
eGFR: 115 mL/min/{1.73_m2} (ref >59)

## 2023-08-22 LAB — HEMOGLOBIN A1C
Est. average glucose Bld gHb Est-mCnc: 134 mg/dL
Hgb A1c MFr Bld: 6.3 % — ABNORMAL HIGH (ref 4.8–5.6)

## 2023-08-24 LAB — URINE CULTURE

## 2023-08-29 ENCOUNTER — Other Ambulatory Visit (HOSPITAL_COMMUNITY): Payer: Self-pay

## 2023-08-29 LAB — HM PAP SMEAR: HM Pap smear: NORMAL

## 2023-08-29 LAB — RESULTS CONSOLE HPV: CHL HPV: NEGATIVE

## 2023-08-29 MED ORDER — WEGOVY 0.25 MG/0.5ML ~~LOC~~ SOAJ
0.2500 mg | SUBCUTANEOUS | 0 refills | Status: DC
  Filled 2023-08-29: qty 2, 28d supply, fill #0

## 2023-09-01 ENCOUNTER — Other Ambulatory Visit (HOSPITAL_COMMUNITY): Payer: Self-pay

## 2023-09-02 ENCOUNTER — Encounter: Payer: Self-pay | Admitting: Family Medicine

## 2023-09-02 ENCOUNTER — Ambulatory Visit: Payer: BC Managed Care – PPO | Admitting: Family Medicine

## 2023-09-02 VITALS — BP 138/84 | HR 79 | Temp 98.2°F | Resp 18 | Ht 65.0 in | Wt 250.0 lb

## 2023-09-02 DIAGNOSIS — J0110 Acute frontal sinusitis, unspecified: Secondary | ICD-10-CM

## 2023-09-02 MED ORDER — AZITHROMYCIN 250 MG PO TABS: ORAL_TABLET | ORAL | 0 refills | Status: AC

## 2023-09-02 NOTE — Progress Notes (Signed)
Acute Office Visit  Subjective:     Patient ID: Amy Rodgers, female    DOB: [birth year], 37 y.o.   MRN: [id number]  No chief complaint on file.   HPI Patient is in today for acute visit.  Pt reports nasal congestion on Friday when she woke up. She reports this lasted for the last several days and then she woke up this morning with sore throat and cough. She has tried Coricidin which has helped with fatigue and nausea. No fever or chills. Some chest tightness and she has done some Albuterol this morning which helped some. She has facial and nasal pressure/congestion. Has nasal spray at home, haven't used it yet.  Review of Systems  HENT:  Positive for congestion and sore throat.   Respiratory:  Positive for cough.   All other systems reviewed and are negative.      Objective:    LMP 07/16/2023    Physical Exam Vitals and nursing note reviewed.  Constitutional:      Appearance: Normal appearance. She is normal weight.  HENT:     Head: Normocephalic and atraumatic.     Right Ear: Tympanic membrane, ear canal and external ear normal.     Left Ear: Tympanic membrane, ear canal and external ear normal.     Nose: Nose normal.     Comments: Boggy nasal turbinates bilaterally     Mouth/Throat:     Mouth: Mucous membranes are moist.     Pharynx: Oropharynx is clear.  Eyes:     Conjunctiva/sclera: Conjunctivae normal.     Pupils: Pupils are equal, round, and reactive to light.  Cardiovascular:     Rate and Rhythm: Normal rate and regular rhythm.     Pulses: Normal pulses.     Heart sounds: Normal heart sounds.  Pulmonary:     Effort: Pulmonary effort is normal.     Breath sounds: Normal breath sounds.  Abdominal:     General: Abdomen is flat. Bowel sounds are normal.  Skin:    General: Skin is warm.     Capillary Refill: Capillary refill takes less than 2 seconds.  Neurological:     General: No focal deficit present.     Mental Status: She is alert and oriented to  person, place, and time. Mental status is at baseline.  Psychiatric:        Mood and Affect: Mood normal.        Behavior: Behavior normal.        Thought Content: Thought content normal.        Judgment: Judgment normal.   No results found for any visits on 09/02/23.      Assessment & Plan:   Problem List Items Addressed This Visit   None Acute non-recurrent frontal sinusitis -     Azithromycin; Take 2 tablets on day 1, then 1 tablet daily on days 2 through 5  Dispense: 6 tablet; Refill: 0   Symptoms consistent with sinusitis. Treat with azithromycin x 5 days. Advised to start nasal steroid spray for nasal congestion. Rest and plenty of fluids.  No orders of the defined types were placed in this encounter.   No follow-ups on file.  Suzan Slick, MD

## 2023-09-08 ENCOUNTER — Other Ambulatory Visit (HOSPITAL_COMMUNITY): Payer: Self-pay

## 2023-09-13 ENCOUNTER — Other Ambulatory Visit: Payer: Self-pay | Admitting: Family Medicine

## 2023-09-15 ENCOUNTER — Other Ambulatory Visit (HOSPITAL_BASED_OUTPATIENT_CLINIC_OR_DEPARTMENT_OTHER): Payer: Self-pay

## 2023-09-15 MED ORDER — LOSARTAN POTASSIUM 50 MG PO TABS
50.0000 mg | ORAL_TABLET | Freq: Every day | ORAL | 1 refills | Status: DC
  Filled 2023-09-15: qty 30, 30d supply, fill #0

## 2023-09-26 ENCOUNTER — Ambulatory Visit: Payer: BC Managed Care – PPO | Admitting: Family Medicine

## 2023-09-26 ENCOUNTER — Other Ambulatory Visit: Payer: Self-pay | Admitting: Family Medicine

## 2023-09-26 DIAGNOSIS — K589 Irritable bowel syndrome without diarrhea: Secondary | ICD-10-CM

## 2023-09-26 DIAGNOSIS — G43109 Migraine with aura, not intractable, without status migrainosus: Secondary | ICD-10-CM | POA: Insufficient documentation

## 2023-09-26 DIAGNOSIS — N943 Premenstrual tension syndrome: Secondary | ICD-10-CM | POA: Insufficient documentation

## 2023-09-26 HISTORY — DX: Irritable bowel syndrome, unspecified: K58.9

## 2023-10-05 ENCOUNTER — Other Ambulatory Visit: Payer: Self-pay

## 2023-10-05 ENCOUNTER — Encounter: Payer: Self-pay | Admitting: Emergency Medicine

## 2023-10-05 ENCOUNTER — Ambulatory Visit
Admission: EM | Admit: 2023-10-05 | Discharge: 2023-10-05 | Disposition: A | Discharge status: Home / Self Care | Payer: BC Managed Care – PPO | Attending: Family Medicine | Admitting: Family Medicine

## 2023-10-05 DIAGNOSIS — J0100 Acute maxillary sinusitis, unspecified: Secondary | ICD-10-CM

## 2023-10-05 DIAGNOSIS — J3489 Other specified disorders of nose and nasal sinuses: Secondary | ICD-10-CM

## 2023-10-05 MED ORDER — AMOXICILLIN-POT CLAVULANATE 875-125 MG PO TABS: 1.0000 | ORAL_TABLET | Freq: Two times a day (BID) | ORAL | 0 refills | Status: AC

## 2023-10-05 MED ORDER — PREDNISONE 20 MG PO TABS: ORAL_TABLET | ORAL | 0 refills | Status: DC

## 2023-10-05 MED ORDER — METHYLPREDNISOLONE ACETATE 80 MG/ML IJ SUSP
80.0000 mg | Freq: Once | INTRAMUSCULAR | Status: AC
  Administered 2023-10-05: 80 mg via INTRAMUSCULAR

## 2023-10-05 NOTE — ED Provider Notes (Signed)
[facility]    CSN: [id number] Arrival date & time: 10/05/23  0831      History   Chief Complaint Chief Complaint  Patient presents with   Facial Pain   Nasal Congestion    HPI Amy Rodgers is a 37 y.o. female.   HPI 37 year old female presents with nasal congestion, sinus pressure, facial pain for 1 week.  PMH significant for morbid obesity, IBS, and anemia.  Past Medical History:  Diagnosis Date   Anemia    Borderline   Anxiety    Asthma    Complication of anesthesia    "freaks out waking up from surgery"   Depression    GERD (gastroesophageal reflux disease)    Gestational diabetes    IBS (irritable bowel syndrome)    Kidney stones    Mononucleosis    Pneumonia    10/16   PONV (postoperative nausea and vomiting)    Pre-diabetes     Patient Active Problem List   Diagnosis Date Noted   Premenstrual tension syndrome 09/26/2023   Morbid obesity (HCC) 09/26/2023   Migraine with aura and without status migrainosus, not intractable 09/26/2023   Irritable bowel syndrome 09/26/2023   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/2023   Gastroesophageal reflux disease 02/07/2023   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   Fatty liver 12/08/2020   Calculus of gallbladder without cholecystitis without obstruction 12/08/2020   Essential hypertension 07/26/2020   PCOS (polycystic ovarian syndrome) 06/21/2019   Status post repeat low transverse cesarean section 06/21/2019   Pelvic and perineal pain 08/21/2012   Female infertility associated with anovulation 06/23/2012    Past Surgical History:  Procedure Laterality Date   BACK SURGERY     CESAREAN SECTION N/A 12/28/2016   Procedure: CESAREAN SECTION;  Surgeon: Genia Del, MD;  Location: [facility];  Service: Obstetrics;  Laterality: N/A;   CESAREAN SECTION N/A 06/21/2019   Procedure: Repeat CESAREAN SECTION;  Surgeon: Shea Evans, MD;  Location: [facility];  Service: Obstetrics;  Laterality: N/A;  EDD: 06/28/19    CHROMOPERTUBATION Bilateral 10/26/2015   Procedure: CHROMOPERTUBATION;  Surgeon: Shea Evans, MD;  Location: [facility];  Service: Gynecology;  Laterality: Bilateral;   COLONOSCOPY     Kidney stent     KNEE SURGERY Left    LAPAROSCOPY N/A 10/26/2015   Procedure: LAPAROSCOPY DIAGNOSTIC ;  Surgeon: Shea Evans, MD;  Location: [facility];  Service: Gynecology;  Laterality: N/A;   TONSILLECTOMY     UPPER GASTROINTESTINAL ENDOSCOPY      OB History     Gravida  3   Para  1   Term  1   Preterm      AB  0   Living  2      SAB  0   IAB  0   Ectopic  0   Multiple  0   Live Births  2            Home Medications    Prior to Admission medications   Medication Sig Start Date End Date Taking? Authorizing Provider  albuterol (VENTOLIN HFA) 108 (90 Base) MCG/ACT inhaler Inhale into the lungs. 11/08/20  Yes [provider]  amoxicillin-clavulanate (AUGMENTIN) 875-125 MG tablet Take 1 tablet by mouth 2 (two) times daily for 10 days. 10/05/23 10/15/23 Yes Trevor Iha, FNP  colestipol (COLESTID) 1 g tablet Take 1 g by mouth daily.   Yes [provider]  dicyclomine (BENTYL) 10 MG capsule Take 10 mg by mouth 4 (four) times  daily as needed.   Yes [provider]  losartan (COZAAR) 50 MG tablet Take 1 tablet (50 mg total) by mouth daily. 09/15/23  Yes Rucker, Magdalen Spatz, MD  omeprazole (PRILOSEC) 40 MG capsule Take 40 mg by mouth 2 (two) times daily. 09/20/23  Yes [provider]  OZEMPIC, 0.25 OR 0.5 MG/DOSE, 2 MG/3ML SOPN SMARTSIG:0.25 Milligram(s) SUB-Q Once a Week 09/01/23  Yes [provider]  predniSONE (DELTASONE) 20 MG tablet Take 3 tabs PO daily x 5 days. 10/05/23  Yes Trevor Iha, FNP    Family History Family History  Problem Relation Age of Onset   Hypertension Mother    Hypertension Father    Heart disease Father    Diabetes Father    Cancer Maternal Grandmother    Breast cancer Maternal Grandmother    Cancer Maternal  Grandfather    Cancer Maternal Aunt     Social History Social History   Tobacco Use   Smoking status: Never   Smokeless tobacco: Never  Vaping Use   Vaping status: Never Used  Substance Use Topics   Alcohol use: No   Drug use: No     Allergies   Phentermine, Poison oak extract, and Codeine   Review of Systems Review of Systems  HENT:  Positive for congestion, sinus pressure and sinus pain.   All other systems reviewed and are negative.    Physical Exam Triage Vital Signs ED Triage Vitals  Encounter Vitals Group     BP 10/05/23 0854 (!) 139/94     Systolic BP Percentile --      Diastolic BP Percentile --      Pulse Rate 10/05/23 0854 94     Resp 10/05/23 0854 18     Temp 10/05/23 0854 99 F (37.2 C)     Temp Source 10/05/23 0854 Oral     SpO2 10/05/23 0854 96 %     Weight --      Height --      Head Circumference --      Peak Flow --      Pain Score 10/05/23 0852 6     Pain Loc --      Pain Education --      Exclude from Growth Chart --    No data found.  Updated Vital Signs BP (!) 139/94 (BP Location: Left Arm)   Pulse 94   Temp 99 F (37.2 C) (Oral)   Resp 18   LMP 09/08/2023 (Approximate)   SpO2 96%   Breastfeeding No     Physical Exam Vitals and nursing note reviewed.  Constitutional:      Appearance: Normal appearance. She is normal weight.  HENT:     Head: Normocephalic and atraumatic.     Right Ear: Tympanic membrane and external ear normal.     Left Ear: Tympanic membrane and external ear normal.     Ears:     Comments: Significant eustachian tube dysfunction noted bilaterally    Nose:     Right Sinus: Maxillary sinus tenderness and frontal sinus tenderness present.     Left Sinus: Maxillary sinus tenderness and frontal sinus tenderness present.     Comments: Turbinates are erythematous/edematous    Mouth/Throat:     Mouth: Mucous membranes are moist.     Pharynx: Oropharynx is clear. Uvula midline. Posterior oropharyngeal  erythema and uvula swelling present.     Tonsils: 2+ on the right. 2+ on the left.     Comments:  Moderate amount of clear drainage posterior oropharynx noted Eyes:     Extraocular Movements: Extraocular movements intact.     Conjunctiva/sclera: Conjunctivae normal.     Pupils: Pupils are equal, round, and reactive to light.  Cardiovascular:     Rate and Rhythm: Normal rate and regular rhythm.     Pulses: Normal pulses.     Heart sounds: Normal heart sounds.  Pulmonary:     Effort: Pulmonary effort is normal.     Breath sounds: Normal breath sounds. No wheezing, rhonchi or rales.  Musculoskeletal:        General: Normal range of motion.     Cervical back: Normal range of motion and neck supple.  Skin:    General: Skin is warm and dry.  Neurological:     General: No focal deficit present.     Mental Status: She is alert and oriented to person, place, and time. Mental status is at baseline.  Psychiatric:        Mood and Affect: Mood normal.        Behavior: Behavior normal.      UC Treatments / Results  Labs (all labs ordered are listed, but only abnormal results are displayed) Labs Reviewed - No data to display  EKG   Radiology No results found.  Procedures Procedures (including critical care time)  Medications Ordered in UC Medications  methylPREDNISolone acetate (DEPO-MEDROL) injection 80 mg (80 mg Intramuscular Given 10/05/23 0944)    Initial Impression / Assessment and Plan / UC Course  I have reviewed the triage vital signs and the nursing notes.  Pertinent labs & imaging results that were available during my care of the patient were reviewed by me and considered in my medical decision making (see chart for details).     MDM: 1.  Acute maxillary sinusitis, recurrence not specified-Rx'd Augmentin 875/125 mg tablet: Take 1 tablet twice daily x 10 days. 2.  Sinus pressure-IM Depo-Medrol 80 mg given once in clinic and prior to discharge per patient request Rx'd  prednisone 20 mg tablet: Take 3 tablets p.o. daily x 5 days. Advised patient to take medications as directed with food to completion.  Advised patient to take prednisone with first dose of Augmentin for the next 5 of 10 days.  Encouraged increase daily water intake to 64 ounces per day while taking this medication.  Advised if symptoms worsen and/or unresolved please follow-up with PCP or here for further evaluation. Final Clinical Impressions(s) / UC Diagnoses   Final diagnoses:  Acute maxillary sinusitis, recurrence not specified  Sinus pressure     Discharge Instructions      Advised patient to take medications as directed with food to completion.  Advised patient to take prednisone with first dose of Augmentin for the next 5 of 10 days.  Encouraged increase daily water intake to 64 ounces per day while taking this medication.  Advised if symptoms worsen and/or unresolved please follow-up with PCP or here for further evaluation.     ED Prescriptions     Medication Sig Dispense Auth. Provider   amoxicillin-clavulanate (AUGMENTIN) 875-125 MG tablet Take 1 tablet by mouth 2 (two) times daily for 10 days. 20 tablet Trevor Iha, FNP   predniSONE (DELTASONE) 20 MG tablet Take 3 tabs PO daily x 5 days. 15 tablet Trevor Iha, FNP      PDMP not reviewed this encounter.   Trevor Iha, FNP 10/05/23 [phone]

## 2023-10-05 NOTE — Discharge Instructions (Addendum)
Advised patient to take medications as directed with food to completion.  Advised patient to take prednisone with first dose of Augmentin for the next 5 of 10 days.  Encouraged increase daily water intake to 64 ounces per day while taking this medication.  Advised if symptoms worsen and/or unresolved please follow-up with PCP or here for further evaluation.

## 2023-10-05 NOTE — ED Triage Notes (Signed)
Patient presents to Urgent Care with complaints of nasal congestion, sinus pressure, facial pain since 1 week ago. Patient reports swollen lymph nodes, ear ache, facial pain, headache, sinus pressure. Has tried Coricidin HBP every 4 hrs for symptoms with minor relief. Feeling increased fatigued.

## 2024-02-09 ENCOUNTER — Ambulatory Visit: Admitting: Family Medicine

## 2024-02-09 ENCOUNTER — Encounter: Payer: Self-pay | Admitting: Family Medicine

## 2024-02-09 VITALS — BP 158/96 | HR 75 | Temp 97.5°F | Resp 18 | Ht 65.0 in | Wt 231.9 lb

## 2024-02-09 DIAGNOSIS — R399 Unspecified symptoms and signs involving the genitourinary system: Secondary | ICD-10-CM | POA: Diagnosis not present

## 2024-02-09 LAB — POCT URINALYSIS DIP (CLINITEK)
Glucose, UA: NEGATIVE mg/dL
Ketones, POC UA: NEGATIVE mg/dL
Nitrite, UA: NEGATIVE
POC PROTEIN,UA: 30 — AB
Spec Grav, UA: 1.025 (ref 1.010–1.025)
Urobilinogen, UA: 0.2 U/dL
pH, UA: 6 (ref 5.0–8.0)

## 2024-02-09 MED ORDER — NITROFURANTOIN MONOHYD MACRO 100 MG PO CAPS
100.0000 mg | ORAL_CAPSULE | Freq: Two times a day (BID) | ORAL | 0 refills | Status: DC
Start: 2024-02-09 — End: 2024-07-14

## 2024-02-09 NOTE — Progress Notes (Signed)
 Acute Office Visit  Subjective:     Patient ID: Amy Rodgers, female    DOB: 1986/04/07, 38 y.o.   MRN: [id number]  Chief Complaint  Patient presents with   Flank Pain    Patient states that her left kidney began giving her pain yesterday, she states that she also has lower abdominal pressure. Patient said that the color turned brown this morning     Flank Pain Associated symptoms include abdominal pain.  Patient is in today for acute visit.  Flank pain Pt started having left flank pain that started yesterday. She also reports lower abdominal pressure. She noticed a dark brown urine this morning. She has hx of irregular menses with a hx of PCOS. She has had urine pregnancy test that was negative at home. She is drinking water about 90 oz a day.  She has hx of kidney stones when she was in her teenage years. She does have urinary frequency.  Review of Systems  Gastrointestinal:  Positive for abdominal pain.  Genitourinary:  Positive for flank pain and frequency.  All other systems reviewed and are negative.      Objective:    BP (!) 158/96   Pulse 75   Temp (!) 97.5 F (36.4 C) (Oral)   Resp 18   Ht 5\' 5"  (1.651 m)   Wt 231 lb 14.4 oz (105.2 kg)   SpO2 99%   BMI 38.59 kg/m  BP Readings from Last 3 Encounters:  02/09/24 (!) 158/96  10/05/23 (!) 139/94  09/02/23 138/84      Physical Exam Vitals and nursing note reviewed.  Constitutional:      Appearance: Normal appearance. She is obese.  HENT:     Head: Normocephalic and atraumatic.     Right Ear: External ear normal.     Left Ear: External ear normal.     Nose: Nose normal.     Mouth/Throat:     Mouth: Mucous membranes are moist.     Pharynx: Oropharynx is clear.  Eyes:     Conjunctiva/sclera: Conjunctivae normal.     Pupils: Pupils are equal, round, and reactive to light.  Cardiovascular:     Rate and Rhythm: Normal rate.  Pulmonary:     Effort: Pulmonary effort is normal.  Abdominal:      General: Abdomen is flat. Bowel sounds are normal.     Tenderness: There is abdominal tenderness.  Skin:    General: Skin is warm.     Capillary Refill: Capillary refill takes less than 2 seconds.  Neurological:     General: No focal deficit present.     Mental Status: She is alert and oriented to person, place, and time. Mental status is at baseline.  Psychiatric:        Mood and Affect: Mood normal.        Behavior: Behavior normal.        Thought Content: Thought content normal.        Judgment: Judgment normal.   Results for orders placed or performed in visit on 02/09/24  POCT URINALYSIS DIP (CLINITEK)  Result Value Ref Range   Color, UA brown (A) yellow   Clarity, UA cloudy (A) clear   Glucose, UA negative negative mg/dL   Bilirubin, UA small (A) negative   Ketones, POC UA negative negative mg/dL   Spec Grav, UA 4.098 1.191 - 1.025   Blood, UA large (A) negative   pH, UA 6.0 5.0 - 8.0   POC  PROTEIN,UA =30 (A) negative, trace   Urobilinogen, UA 0.2 0.2 or 1.0 E.U./dL   Nitrite, UA Negative Negative   Leukocytes, UA Moderate (2+) (A) Negative        Assessment & Plan:   Problem List Items Addressed This Visit   None Visit Diagnoses       UTI symptoms    -  Primary   Relevant Orders   POCT URINALYSIS DIP (CLINITEK) (Completed)   Urine Culture     UTI symptoms -     POCT URINALYSIS DIP (CLINITEK) -     Urine Culture -     Nitrofurantoin Monohyd Macro; Take 1 capsule (100 mg total) by mouth 2 (two) times daily.  Dispense: 10 capsule; Refill: 0   Pt with UTI symptoms, positive urine dip. Send for culture. Treat with Macrobid x 5 days. If culture negative and pain persists, may need to proceed with CT scan.  No orders of the defined types were placed in this encounter.   No follow-ups on file.  Suzan Slick, MD

## 2024-02-11 ENCOUNTER — Telehealth: Payer: Self-pay

## 2024-02-11 ENCOUNTER — Ambulatory Visit
Admission: RE | Admit: 2024-02-11 | Discharge: 2024-02-11 | Disposition: A | Discharge status: Home / Self Care | Source: Ambulatory Visit | Attending: Family Medicine | Admitting: Family Medicine

## 2024-02-11 ENCOUNTER — Telehealth: Payer: Self-pay | Admitting: Family Medicine

## 2024-02-11 ENCOUNTER — Encounter: Payer: Self-pay | Admitting: Family Medicine

## 2024-02-11 DIAGNOSIS — R109 Unspecified abdominal pain: Secondary | ICD-10-CM

## 2024-02-11 DIAGNOSIS — R399 Unspecified symptoms and signs involving the genitourinary system: Secondary | ICD-10-CM

## 2024-02-11 LAB — URINE CULTURE

## 2024-02-11 NOTE — Telephone Encounter (Signed)
 Copied from CRM [id number]. Topic: Clinical - Prescription Issue >> Feb 11, 2024 11:21 AM Amy Rodgers wrote: Reason for CRM: Pt called regarding antibiotics since lab results are negative do you want her to stop taking antibiotics? Please contact pt at  [phone].

## 2024-02-11 NOTE — Telephone Encounter (Signed)
Messaged pt back on mychart

## 2024-02-11 NOTE — Addendum Note (Signed)
 Addended by: Suzan Slick on: 02/11/2024 09:17 AM   Modules accepted: Orders

## 2024-02-11 NOTE — Telephone Encounter (Addendum)
 Pt unable to view results.Sireen would like a nurse to call her at [phone]  Please inform pt that I've ordered the CT scan to rule out kidney stone.

## 2024-02-11 NOTE — Telephone Encounter (Signed)
 Spoke with patient and she is aware of provider message stating she can stop taking the antibiotics.

## 2024-02-11 NOTE — Telephone Encounter (Signed)
Pt can stop antibiotics

## 2024-02-11 NOTE — Telephone Encounter (Signed)
 Lvm  informing patient of message from provider , also stated that if there was any questions to call us back.

## 2024-02-11 NOTE — Telephone Encounter (Signed)
 Ct scan results returned today. I have attempted to contact pt 2x on cell phone. I am out of the office for the remainder of today. I did leave pt a detailed voicemail on her cell phone with the results and what to do at home to treat this.

## 2024-02-11 NOTE — Telephone Encounter (Signed)
 Copied from CRM [id number]. Topic: General - Other >> Feb 11, 2024 10:03 AM Eunice Blase wrote: Reason for CRM: Received call from [facility] per Kendal Hymen Ph: [phone], fax: [phone] for CT scan, pt had scheduled appt today but is rescheduling. They need a prior authorization.  Please contact [facility] with information.

## 2024-02-11 NOTE — Telephone Encounter (Signed)
 Your urine culture was negative. How are your symptoms with the Macrobid?  Written by Suzan Slick, MD on 02/11/2024  8:56 AM EDT   Patient had questions regarding her urine culture results. Read results to patient as stated in message above. Patient states Macrobid is causing her to have abdominal pain, and that she is still experiencing pain on right flank area. Patient reports she is unable to tell if their is blood in her urine due to bright orange urine on medication. Patient reports she would like to do the CT scan as previously discussed with Dr. Wyline Mood. Advised would forward information to appropriate person for follow-up.   Copied from CRM [id number]. Topic: Clinical - Lab/Test Results >> Feb 11, 2024  8:40 AM Clayton Bibles wrote: Reason for CRM: Kerianne would like a nurse to call her at [phone] to discuss her lab results. She is unable to see results in MyChart. Thanks

## 2024-03-05 ENCOUNTER — Ambulatory Visit: Payer: Self-pay

## 2024-03-05 ENCOUNTER — Encounter: Payer: Self-pay | Admitting: Family Medicine

## 2024-03-05 DIAGNOSIS — N201 Calculus of ureter: Secondary | ICD-10-CM | POA: Insufficient documentation

## 2024-03-05 NOTE — Telephone Encounter (Signed)
 Chief Complaint: back pain Symptoms: back pain, intermittent hematuria, sharp groin pain Frequency: intermittent symptoms since she was diagnosed with a kidney stone on 3/24 Pertinent Negatives: Patient denies fever, N/V, hematuria today, numbness Disposition: [x] ED /[] Urgent Care (no appt availability in office) / [] Appointment(In office/virtual)/ []  [facility]/ [] Home Care/ [] Refused Recommended Disposition /[] [facility]/ []  Follow-up with PCP Additional Notes: Pt reports she had severe lower R sided back pain last night and Monday this week. Pt has hx of kidney stones and was diagnosed with a non-obstructing kidney stone end of March on CT (seen in the office 3/24). Since then pt has had intermittent back pain with hematuria. Back pain has worsened this week. Pt states she has not had hematuria since Monday. Additionally, with back pain, pt is experiencing new shooting pains in her groin that she rates 5-6/10 with movement. Pt has the sensation of "something being in there." Pt states her pain last night was responsive to ibuprofen  and a heating pad. Today her back pain is better but the groin pain is still present. Pt denies fever, N/V, weakness or numbness to her legs. Pt does endorse frequency and urgency and states she has even had episodes of incontinence and is wearing briefs. Pt feels she is not emptying her bladder and only has a small amount of output each time she urinates. RN advised pt to go to the ED since she may need CT imaging to assess if the stone has moved or has become obstructing. Pt agrees to that plan, she is currently dropping her child off at a play-date but will go to the ED after. RN advised pt if she develops difficulty walking, weakness, N/V to call 911. Pt verbalized understanding.    Reason for Disposition  Patient sounds very sick or weak to the triager  Answer Assessment - Initial Assessment Questions 1. ONSET: "When did the pain begin?"       Intermittent back pain since end of March, dx with a non-obstructing kidney stone 3/24 2. LOCATION: "Where does it hurt?" (upper, mid or lower back)     Lower R side  3. SEVERITY: "How bad is the pain?"  (e.g., Scale 1-10; mild, moderate, or severe)   - MILD (1-3): Doesn't interfere with normal activities.    - MODERATE (4-7): Interferes with normal activities or awakens from sleep.    - SEVERE (8-10): Excruciating pain, unable to do any normal activities.      2/10 back pain, groin pain that is a 5/10. Dull constant pain with intensity fluctuating. Feels like "something is in there." 4. PATTERN: "Is the pain constant?" (e.g., yes, no; constant, intermittent)      Intermittent  5. RADIATION: "Does the pain shoot into your legs or somewhere else?"     Last night it almost felt like period cramps 6. CAUSE:  "What do you think is causing the back pain?"      Kidney stone 7. BACK OVERUSE:  "Any recent lifting of heavy objects, strenuous work or exercise?"     Deep cleaning last night 8. MEDICINES: "What have you taken so far for the pain?" (e.g., nothing, acetaminophen , NSAIDS)     Ibuprofen   9. NEUROLOGIC SYMPTOMS: "Do you have any weakness, numbness, or problems with bowel/bladder control?"     Frequency, urgency w/ episodes of near incontinence, putting on depends 10. OTHER SYMPTOMS: "Do you have any other symptoms?" (e.g., fever, abdomen pain, burning with urination, blood in urine)  Frequency with small amount of urine output, intermittent hematuria, incontinence (wearing depends). Pt states Monday this week she had severe back pain that eventually eased off. Pt then developed sharp groin pains. Last night back pain was severe again with groin pain. Pt used ibuprofen  and a heating pad, states it eased up. Pt now reports 2/10 back pain. Pt reports 2-3/10 groin pain with sitting. Pt states when she gets up and moves it hurts more. States it feels like she'd been kicked there - got up to a  5-6/10. No hematuria since Monday. Pt states she's only ever had obstructing stones and those have had to be removed  Protocols used: Back Pain-A-AH

## 2024-03-08 NOTE — Telephone Encounter (Signed)
 Pt messaged me via mychart. Have responded to pt regarding this issue.

## 2024-07-14 ENCOUNTER — Ambulatory Visit: Admission: EM | Admit: 2024-07-14 | Discharge: 2024-07-14 | Disposition: A | Discharge status: Home / Self Care

## 2024-07-14 ENCOUNTER — Encounter: Payer: Self-pay | Admitting: Family Medicine

## 2024-07-14 DIAGNOSIS — H6693 Otitis media, unspecified, bilateral: Secondary | ICD-10-CM

## 2024-07-14 MED ORDER — AMOXICILLIN 875 MG PO TABS: 875.0000 mg | ORAL_TABLET | Freq: Two times a day (BID) | ORAL | 0 refills | Status: AC

## 2024-07-14 NOTE — ED Provider Notes (Signed)
 Amy Rodgers CARE    CSN: [id number] Arrival date & time: 07/14/24  1926      History   Chief Complaint Chief Complaint  Patient presents with   Sore Throat   Cough   Nasal Congestion    HPI Amy Rodgers is a 38 y.o. female.   HPI 38 year old female presents with sore throat and nasal congestion x 4 days.  PMH significant for obesity, anxiety, and IBS.  Patient reports she is currently 9.[redacted] weeks pregnant  Past Medical History:  Diagnosis Date   Anemia    Borderline   Anxiety    Asthma    Complication of anesthesia    freaks out waking up from surgery   Depression    GERD (gastroesophageal reflux disease)    Gestational diabetes    IBS (irritable bowel syndrome)    Kidney stones    Mononucleosis    Pneumonia    10/16   PONV (postoperative nausea and vomiting)    Pre-diabetes     Patient Active Problem List   Diagnosis Date Noted   Premenstrual tension syndrome 09/26/2023   Morbid obesity (HCC) 09/26/2023   Migraine with aura and without status migrainosus, not intractable 09/26/2023   Irritable bowel syndrome 09/26/2023   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/2023   Gastroesophageal reflux disease 02/07/2023   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   Fatty liver 12/08/2020   Calculus of gallbladder without cholecystitis without obstruction 12/08/2020   Essential hypertension 07/26/2020   PCOS (polycystic ovarian syndrome) 06/21/2019   Status post repeat low transverse cesarean section 06/21/2019   Pelvic and perineal pain 08/21/2012   Female infertility associated with anovulation 06/23/2012    Past Surgical History:  Procedure Laterality Date   BACK SURGERY     CESAREAN SECTION N/A 12/28/2016   Procedure: CESAREAN SECTION;  Surgeon: Marie-Lyne Lavoie, MD;  [facility]: [facility];  Service: Obstetrics;  Laterality: N/A;   CESAREAN SECTION N/A 06/21/2019   Procedure: Repeat CESAREAN SECTION;  Surgeon: Barbette Knock, MD;  Location: [facility];  Service:  Obstetrics;  Laterality: N/A;  EDD: 06/28/19   CHROMOPERTUBATION Bilateral 10/26/2015   Procedure: CHROMOPERTUBATION;  Surgeon: Knock Barbette, MD;  Location: [facility];  Service: Gynecology;  Laterality: Bilateral;   COLONOSCOPY     Kidney stent     KNEE SURGERY Left    LAPAROSCOPY N/A 10/26/2015   Procedure: LAPAROSCOPY DIAGNOSTIC ;  Surgeon: Knock Barbette, MD;  Location: [facility];  Service: Gynecology;  Laterality: N/A;   TONSILLECTOMY     UPPER GASTROINTESTINAL ENDOSCOPY      OB History     Gravida  4   Para  1   Term  1   Preterm      AB  0   Living  2      SAB  0   IAB  0   Ectopic  0   Multiple  0   Live Births  2            Home Medications    Prior to Admission medications   Medication Sig Start Date End Date Taking? Authorizing Provider  amoxicillin  (AMOXIL ) 875 MG tablet Take 1 tablet (875 mg total) by mouth 2 (two) times daily for 10 days. 07/14/24 07/24/24 Yes Teddy Sharper, FNP  pantoprazole (PROTONIX) 40 MG tablet Take 40 mg by mouth daily. 07/05/24  Yes [provider]  albuterol (VENTOLIN HFA) 108 (90 Base) MCG/ACT inhaler Inhale into the lungs. 11/08/20   [provider]  metFORMIN  (GLUCOPHAGE -XR) 500 MG 24 hr tablet Take 500 mg by mouth 2 (two) times daily.    [provider]  Prenatal Multivit-Min-Fe-FA (PRENATAL 1 + IRON PO)     [provider]    Family History Family History  Problem Relation Age of Onset   Hypertension Mother    Hypertension Father    Heart disease Father    Diabetes Father    Cancer Maternal Grandmother    Breast cancer Maternal Grandmother    Cancer Maternal Grandfather    Cancer Maternal Aunt     Social History Social History   Tobacco Use   Smoking status: Never   Smokeless tobacco: Never  Vaping Use   Vaping status: Never Used  Substance Use Topics   Alcohol use: No   Drug use: No     Allergies   Phentermine, Poison oak extract, and Codeine   Review of  Systems Review of Systems  HENT:  Positive for congestion and ear pain.   Respiratory:  Positive for cough.   All other systems reviewed and are negative.    Physical Exam Triage Vital Signs ED Triage Vitals  Encounter Vitals Group     BP      Girls Systolic BP Percentile      Girls Diastolic BP Percentile      Boys Systolic BP Percentile      Boys Diastolic BP Percentile      Pulse      Resp      Temp      Temp src      SpO2      Weight      Height      Head Circumference      Peak Flow      Pain Score      Pain Loc      Pain Education      Exclude from Growth Chart    No data found.  Updated Vital Signs BP (!) 146/92 (BP Location: Right Arm)   Pulse (!) 106   Temp 98.3 F (36.8 C) (Oral)   Resp 18   LMP 09/08/2023 (Approximate)   SpO2 99%    Physical Exam Vitals and nursing note reviewed.  Constitutional:      Appearance: Normal appearance. She is obese. She is ill-appearing.  HENT:     Head: Normocephalic and atraumatic.     Right Ear: Ear canal and external ear normal.     Left Ear: Ear canal and external ear normal.     Ears:     Comments: Bilateral TM's: Erythematous, bulging    Mouth/Throat:     Mouth: Mucous membranes are moist.     Pharynx: Oropharynx is clear.  Eyes:     Extraocular Movements: Extraocular movements intact.     Conjunctiva/sclera: Conjunctivae normal.     Pupils: Pupils are equal, round, and reactive to light.  Cardiovascular:     Rate and Rhythm: Normal rate and regular rhythm.     Pulses: Normal pulses.     Heart sounds: Normal heart sounds.  Pulmonary:     Effort: Pulmonary effort is normal.     Breath sounds: Normal breath sounds. No wheezing, rhonchi or rales.  Musculoskeletal:        General: Normal range of motion.  Skin:    General: Skin is warm and dry.  Neurological:     General: No focal deficit present.     Mental Status: She is alert and  oriented to person, place, and time. Mental status is at baseline.   Psychiatric:        Mood and Affect: Mood normal.        Behavior: Behavior normal.      UC Treatments / Results  Labs (all labs ordered are listed, but only abnormal results are displayed) Labs Reviewed - No data to display  EKG   Radiology No results found.  Procedures Procedures (including critical care time)  Medications Ordered in UC Medications - No data to display  Initial Impression / Assessment and Plan / UC Course  I have reviewed the triage vital signs and the nursing notes.  Pertinent labs & imaging results that were available during my care of the patient were reviewed by me and considered in my medical decision making (see chart for details).     MDM: 1.  Acute bilateral otitis media-Rx'd amoxicillin  875 mg tablet: Take 1 tablet twice daily x 10 days.Advised patient to take medication as directed with food to completion.  Encouraged increase daily water intake to 64 ounces per day while taking this medication.  Advised if symptoms worsen and/or unresolved please follow-up with your OB or PCP for further evaluation.  Patient discharged home, hemodynamically stable. Final Clinical Impressions(s) / UC Diagnoses   Final diagnoses:  Acute bilateral otitis media     Discharge Instructions      Advised patient to take medication as directed with food to completion.  Encouraged increase daily water intake to 64 ounces per day while taking this medication.  Advised if symptoms worsen and/or unresolved please follow-up with your OB or PCP for further evaluation.     ED Prescriptions     Medication Sig Dispense Auth. Provider   amoxicillin  (AMOXIL ) 875 MG tablet Take 1 tablet (875 mg total) by mouth 2 (two) times daily for 10 days. 20 tablet Saleena Tamas, FNP      PDMP not reviewed this encounter.   Teddy Sharper, FNP 07/14/24 2020

## 2024-07-14 NOTE — ED Triage Notes (Signed)
 Pt c/o cough, congestion and sore throat x 4 days. Flu and COVID neg this am at home. Some facial pain/pressure and ear pain as well. Hx of sinus infections. Coricidin, flonase, saline spray and cough drops prn. Currently 9.[redacted] weeks pregnant.

## 2024-07-14 NOTE — Discharge Instructions (Addendum)
 Advised patient to take medication as directed with food to completion.  Encouraged increase daily water intake to 64 ounces per day while taking this medication.  Advised if symptoms worsen and/or unresolved please follow-up with your OB or PCP for further evaluation.

## 2024-07-15 ENCOUNTER — Telehealth: Payer: Self-pay

## 2024-07-21 DIAGNOSIS — O09529 Supervision of elderly multigravida, unspecified trimester: Secondary | ICD-10-CM | POA: Insufficient documentation

## 2024-07-26 DIAGNOSIS — Z8632 Personal history of gestational diabetes: Secondary | ICD-10-CM | POA: Insufficient documentation

## 2024-08-25 NOTE — Progress Notes (Unsigned)
 Patient was seen for Gestational Diabetes/Pre-existing Diabetes During Pregnancy on 08/27/2024  Start time 1139 and End time 1221   Estimated due date: 02/13/2024; [redacted]w[redacted]d  Clinical: Medications:  Current Outpatient Medications:    albuterol (VENTOLIN HFA) 108 (90 Base) MCG/ACT inhaler, Inhale into the lungs., Disp: , Rfl:    metFORMIN  (GLUCOPHAGE -XR) 500 MG 24 hr tablet, Take 500 mg by mouth 2 (two) times daily., Disp: , Rfl:    pantoprazole (PROTONIX) 40 MG tablet, Take 40 mg by mouth daily., Disp: , Rfl:    Prenatal Multivit-Min-Fe-FA (PRENATAL 1 + IRON PO), , Disp: , Rfl:   Medical History:  Past Medical History:  Diagnosis Date   Anemia    Borderline   Anxiety    Asthma    Complication of anesthesia    freaks out waking up from surgery   Depression    GERD (gastroesophageal reflux disease)    Gestational diabetes    IBS (irritable bowel syndrome)    Kidney stones    Mononucleosis    Pneumonia    10/16   PONV (postoperative nausea and vomiting)    Pre-diabetes     Labs: OGTT 1 hour 209 mg/dL obtained 89/01/7973 per referring provider Lab Results  Component Value Date   HGBA1C 6.3 (H) 08/21/2023   Dietary and Lifestyle History: Pt present today with her two children. Pt reports previous GDM treated with oral medication in her first pregnancy and insulin in her second pregnancy. Pt reports she is testing QID, before breakfast and 2 hours after meals TID and before bed. 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 Pt reports eating out once weekly. Pt reports aiming for 30-45 grams per meal and 15 grams at snack. Pt c/o elevated fasting blood sugar with a value of 108 mg/dL. All Pt's questions were answered during this encounter.    Physical Activity: walking 5 days weekly for 20-30 minutes Stress: 47 out of 10 / self care includes: time alone, walking Sleep: 5 hours on average nightly    24 hr Recall:  First Meal: protein shake or 1 slice of carb  smart toast (9g CHO) with peanut butter or 1 packet of strawberries and cream oatmeal ( 23 g CHO)  Snack: 1 banana  Second meal: ham and cheese hot pocket or avocado toast (9g CHO) with egg   Snack:  apple and peanut butter Third meal:  grilled chicken, salad with dressing, 1 slice carb smart toast (9g CHO)  or chicken, sweet potato or chili made with beans, tomatoes, lean beef, sour cream, cheese Snack:  none Beverages:  water  NUTRITION INTERVENTION  Nutrition education (E-1) on the following topics:   Initial Follow-up  [x]  []  Definition of Gestational Diabetes [x]  []  Why dietary management is important in controlling blood glucose [x]  []  Effects each nutrient has on blood glucose levels [x]  []  Simple carbohydrates vs complex carbohydrates [x]  []  Fluid intake [x]  []  Creating a balanced meal plan [x]  []  Carbohydrate counting  [x]  []  When to check blood glucose levels [x]  []  Proper blood glucose monitoring techniques [x]  []  Effect of stress and stress reduction techniques  [x]  []  Exercise effect on blood glucose levels, appropriate exercise during pregnancy [x]  []  Importance of limiting caffeine and abstaining from alcohol and smoking [x]  []  Medications used for blood sugar control during pregnancy [x]  []  Hypoglycemia and rule of 15 [x]  []  Postpartum self care   Patient has a meter prior to visit. Patient is instructed to  test pre breakfast and 2 hours after each meal. Pt reports fasting obtained today of 108 mg/dL   Patient instructed to monitor glucose levels: QID FBS: 60 - <= 95 mg/dL; 2 hour: <= 879 mg/dL  Patient received handouts: Nutrition Diabetes and Pregnancy Carbohydrate Counting List Blood glucose log Snack ideas for diabetes during pregnancy Plate Planner  Patient will be seen for follow-up as needed.

## 2024-08-27 ENCOUNTER — Ambulatory Visit: Admitting: Dietician

## 2024-08-27 ENCOUNTER — Encounter: Attending: Obstetrics & Gynecology | Admitting: Dietician

## 2024-08-27 DIAGNOSIS — O99810 Abnormal glucose complicating pregnancy: Secondary | ICD-10-CM | POA: Insufficient documentation

## 2024-10-18 ENCOUNTER — Other Ambulatory Visit (HOSPITAL_COMMUNITY): Payer: Self-pay | Admitting: Obstetrics

## 2024-10-18 ENCOUNTER — Other Ambulatory Visit: Payer: Self-pay | Admitting: Obstetrics

## 2024-10-18 ENCOUNTER — Telehealth (HOSPITAL_COMMUNITY): Payer: Self-pay

## 2024-10-18 DIAGNOSIS — O99012 Anemia complicating pregnancy, second trimester: Secondary | ICD-10-CM | POA: Insufficient documentation

## 2024-10-18 NOTE — Telephone Encounter (Signed)
 Auth Submission: NO AUTH NEEDED Site of care: Site of care: [facility] Payer: [employer] [place]  Medication & CPT/J Code(s) submitted: Venofer (Iron Sucrose) J1756 Diagnosis Code: O99.012 Route of submission (phone, fax, portal):  Phone # Fax # Auth type: Buy/Bill HB Units/visits requested: 300mg  x 3 doses Reference number:  Approval from: 10/18/24 to 11/17/24

## 2024-11-03 ENCOUNTER — Inpatient Hospital Stay (HOSPITAL_COMMUNITY): Admission: RE | Admit: 2024-11-03 | Discharge: 2024-11-03 | Attending: Internal Medicine

## 2024-11-03 VITALS — BP 118/62 | HR 81 | Temp 97.6°F | Resp 16

## 2024-11-03 DIAGNOSIS — O99012 Anemia complicating pregnancy, second trimester: Secondary | ICD-10-CM | POA: Insufficient documentation

## 2024-11-03 MED ORDER — IRON SUCROSE 300 MG IVPB - SIMPLE MED
300.0000 mg | Freq: Once | Status: AC
  Administered 2024-11-03: 10:00:00 300 mg via INTRAVENOUS
  Filled 2024-11-03: qty 300

## 2024-11-03 MED ORDER — SODIUM CHLORIDE 0.9 % IV SOLN: INTRAVENOUS | Status: DC | PRN

## 2024-11-03 NOTE — Progress Notes (Signed)
 Diagnosis: Anemia complication pregnancy    Provider:Kelly Fogleman    Procedure: Venofer  300 mg     Note: patient seen in clinic today for Venofer  300 mg #1 of 3.  Patient tolerated well no adverse reaction.  Paitent did not received premeds there were no orders just prn orders. Patient observed 30 minutes post op.  Patient took AVS and was given written and verbal instructions.  Patient has her next appts scheduled. Patient alert, oriented and ambulatory upon discharge

## 2024-11-10 ENCOUNTER — Ambulatory Visit (HOSPITAL_COMMUNITY)

## 2024-11-12 ENCOUNTER — Ambulatory Visit (HOSPITAL_COMMUNITY)
Admission: RE | Admit: 2024-11-12 | Discharge: 2024-11-12 | Disposition: A | Discharge status: Home / Self Care | Source: Ambulatory Visit | Attending: Internal Medicine | Admitting: Internal Medicine

## 2024-11-12 VITALS — BP 119/86 | HR 87 | Temp 97.6°F | Resp 18

## 2024-11-12 DIAGNOSIS — O99012 Anemia complicating pregnancy, second trimester: Secondary | ICD-10-CM

## 2024-11-12 MED ORDER — IRON SUCROSE 300 MG IVPB - SIMPLE MED
300.0000 mg | Freq: Once | Status: AC
  Administered 2024-11-12: 300 mg via INTRAVENOUS
  Filled 2024-11-12: qty 300

## 2024-11-12 MED ORDER — SODIUM CHLORIDE 0.9 % IV SOLN: INTRAVENOUS | Status: DC | PRN

## 2024-11-12 NOTE — Progress Notes (Signed)
 PATIENT CARE CENTER NOTE:  Diagnosis: Anemia complicating pregnancy in second trimester   Provider: Kandyce Sor MD  Procedure: Venofer  300mg    Patient received IV Venofer  300 mg (2 of 3) as ordered by Fogleman, Kelly MD. No pre med given. Observed for at least 30 minutes post infusion. Tolerated well, vitals stable, discharge instructions reviewed and given , verbalized understanding. Patient alert, oriented, and ambulatory at the time of discharge. Pt already has next appoint scheduled

## 2024-11-17 ENCOUNTER — Ambulatory Visit (HOSPITAL_COMMUNITY)
Admission: RE | Admit: 2024-11-17 | Discharge: 2024-11-17 | Disposition: A | Discharge status: Home / Self Care | Source: Ambulatory Visit | Attending: Internal Medicine | Admitting: Internal Medicine

## 2024-11-17 ENCOUNTER — Ambulatory Visit (HOSPITAL_COMMUNITY)

## 2024-11-17 VITALS — BP 118/80 | HR 93 | Temp 97.8°F | Resp 18

## 2024-11-17 DIAGNOSIS — O99012 Anemia complicating pregnancy, second trimester: Secondary | ICD-10-CM

## 2024-11-17 MED ORDER — SODIUM CHLORIDE 0.9 % IV SOLN: INTRAVENOUS | Status: DC | PRN

## 2024-11-17 MED ORDER — IRON SUCROSE 300 MG IVPB - SIMPLE MED
300.0000 mg | Freq: Once | Status: AC
  Administered 2024-11-17: 300 mg via INTRAVENOUS
  Filled 2024-11-17: qty 300

## 2024-11-17 NOTE — Progress Notes (Signed)
 Diagnosis:Anemia complication pregnancy    Provider: KellyFogleman    Procedure: Venofer  300 mg    Note: Patient seen in clinic today for Venofer  300 mg ( 3 of 3 ).  Patient tolerated well, no adverse reaction, some nausea towards end of infusion but she related to her glucose ( patient is diabetic) we supplied ginge ale and graham crackler , cold wash cloth to back of neck and fan, pt states this all is helping.  Pt declined AVS and was given verbal instructions.

## 2024-11-19 ENCOUNTER — Ambulatory Visit (HOSPITAL_COMMUNITY)

## 2024-11-20 ENCOUNTER — Inpatient Hospital Stay (HOSPITAL_COMMUNITY)
Admission: AD | Admit: 2024-11-20 | Discharge: 2024-11-21 | Disposition: A | Discharge status: Home / Self Care | Attending: Family Medicine | Admitting: Family Medicine

## 2024-11-20 ENCOUNTER — Inpatient Hospital Stay (HOSPITAL_COMMUNITY)

## 2024-11-20 ENCOUNTER — Encounter (HOSPITAL_COMMUNITY): Payer: Self-pay | Admitting: *Deleted

## 2024-11-20 DIAGNOSIS — O368330 Maternal care for abnormalities of the fetal heart rate or rhythm, third trimester, not applicable or unspecified: Secondary | ICD-10-CM

## 2024-11-20 DIAGNOSIS — O368130 Decreased fetal movements, third trimester, not applicable or unspecified: Secondary | ICD-10-CM

## 2024-11-20 DIAGNOSIS — Z3A28 28 weeks gestation of pregnancy: Secondary | ICD-10-CM

## 2024-11-20 DIAGNOSIS — Z3493 Encounter for supervision of normal pregnancy, unspecified, third trimester: Secondary | ICD-10-CM

## 2024-11-20 NOTE — MAU Provider Note (Signed)
 Chief Complaint:  Decreased Fetal Movement   HPI   None     Amy Rodgers is a 39 y.o. G4P1002 at [redacted]w[redacted]d who presents to maternity admissions reporting DFM since this afternoon around 1515. She reports pattern of baby movements has been decreased since and she tried drinking, eating, partner stimulating fetus with noise and vibration and still minimal movement so she came to MAU. Fetus recently diagnosed with arrhythmia and followed by [facility].    She endorses regular and vigorous fetal movement since arrival to MAU. Difficulty in tracing FHR due to arrhythmia.   Pregnancy Course: [facility], reviewed.   Past Medical History:  Diagnosis Date   Anemia    Borderline   Anxiety    Asthma    Complication of anesthesia    freaks out waking up from surgery   Depression    GERD (gastroesophageal reflux disease)    Gestational diabetes    IBS (irritable bowel syndrome)    Kidney stones    Mononucleosis    Pneumonia    10/16   PONV (postoperative nausea and vomiting)    Pre-diabetes    OB History  Gravida Para Term Preterm AB Living  4 1 1   0 2  SAB IAB Ectopic Multiple Live Births  0 0 0 0 2    # Outcome Date GA Lbr Len/2nd Weight Sex Type Anes PTL Lv  4 Current           3 Term 12/28/16 [redacted]w[redacted]d  3215 g F CS-LTranv EPI  LIV  2 Gravida           1 Gravida      CS-LTranv      Past Surgical History:  Procedure Laterality Date   BACK SURGERY     CESAREAN SECTION N/A 12/28/2016   Procedure: CESAREAN SECTION;  Surgeon: Percilla Burly, MD;  [facility];  Service: Obstetrics;  Laterality: N/A;   CESAREAN SECTION N/A 06/21/2019   Procedure: Repeat CESAREAN SECTION;  Surgeon: Barbette Knock, MD;  Location: [facility];  Service: Obstetrics;  Laterality: N/A;  EDD: 06/28/19   CHROMOPERTUBATION Bilateral 10/26/2015   Procedure: CHROMOPERTUBATION;  Surgeon: Knock Barbette, MD;  Location: [facility];  Service: Gynecology;  Laterality: Bilateral;   COLONOSCOPY     Kidney stent      KNEE SURGERY Left    LAPAROSCOPY N/A 10/26/2015   Procedure: LAPAROSCOPY DIAGNOSTIC ;  Surgeon: Knock Barbette, MD;  Location: [facility];  Service: Gynecology;  Laterality: N/A;   TONSILLECTOMY     UPPER GASTROINTESTINAL ENDOSCOPY     Family History  Problem Relation Age of Onset   Hypertension Mother    Hypertension Father    Heart disease Father    Diabetes Father    Cancer Maternal Grandmother    Breast cancer Maternal Grandmother    Cancer Maternal Grandfather    Cancer Maternal Aunt    Social History[1] Allergies[2] No medications prior to admission.    I have reviewed patient's Past Medical Hx, Surgical Hx, Family Hx, Social Hx, medications and allergies.   ROS  Pertinent items noted in HPI and remainder of comprehensive ROS otherwise negative.   PHYSICAL EXAM  Patient Vitals for the past 24 hrs:  BP Temp Pulse Resp SpO2 Height Weight  11/21/24 0105 119/71 -- 85 -- -- -- --  11/20/24 2236 121/76 -- -- -- -- -- --  11/20/24 2232 -- (!) 97.5 F (36.4 C) 88 18 100 % 5' 4.5 (1.638 m)  106 kg    Physical Exam Vitals and nursing note reviewed.  Constitutional:      Appearance: Normal appearance.  HENT:     Head: Normocephalic.  Cardiovascular:     Rate and Rhythm: Normal rate.     Pulses: Normal pulses.  Pulmonary:     Effort: Pulmonary effort is normal.  Abdominal:     Comments: Gravid  Skin:    General: Skin is warm and dry.     Capillary Refill: Capillary refill takes less than 2 seconds.  Neurological:     General: No focal deficit present.     Mental Status: She is alert and oriented to person, place, and time.  Psychiatric:        Mood and Affect: Mood normal.        Behavior: Behavior normal.        Thought Content: Thought content normal.        Judgment: Judgment normal.         Difficulty in obtaining FHR tracing due to arrhythmia, audible in room.    Labs: No results found for this or any previous visit (from the past 24  hours).  Imaging:  No results found.  MDM & MAU COURSE  MDM:  Moderate  MAU Course:  BPP to establish fetal well-being as FHR tracing difficult. BPP 8/8 Improved perception of fetal movement since arrival in MAU. Taken together, very reassuring.  Orders Placed This Encounter  Procedures   US  MFM Fetal BPP Wo Non Stress   Discharge patient   No orders of the defined types were placed in this encounter.   ASSESSMENT   1. Movement of fetus present during pregnancy in third trimester   2. [redacted] weeks gestation of pregnancy     PLAN  Discharge home in stable condition. Patient welcome to return to MAU PRN.  Patient has good follow up with [facility] and [facility].    Follow-up Information     [facility] Follow up.   Contact information: [address] [place] [address] [phone]         Maribeth Darcey KIDD, MD Follow up in 4 week(s).   Specialty: Pediatric Cardiology Contact information: [address] [place] [address] [phone]                 Allergies as of 11/21/2024       Reactions   Phentermine Other (See Comments)   Increased depression/anxiety   Poison Oak Extract Rash   Codeine Other (See Comments)   Headaches, itching        Medication List     TAKE these medications    albuterol 108 (90 Base) MCG/ACT inhaler Commonly known as: VENTOLIN HFA Inhale into the lungs.   insulin NPH Human 100 UNIT/ML injection Commonly known as: NOVOLIN N Inject 16 Units into the skin daily before breakfast.   insulin regular 100 units/mL injection Commonly known as: NOVOLIN R Inject 10 Units into the skin at bedtime.   metFORMIN  500 MG 24 hr tablet Commonly known as: GLUCOPHAGE -XR Take 500 mg by mouth 2 (two) times daily.   pantoprazole 40 MG tablet Commonly known as: PROTONIX Take 40 mg by mouth daily.   PRENATAL 1 + IRON  PO        Camie Rote, MSN, CNM 11/21/2024 1:59 AM  Certified  Nurse Midwife, [facility]        [1]  Social History Tobacco Use  Smoking status: Never   Smokeless tobacco: Never  Vaping Use   Vaping status: Never Used  Substance Use Topics   Alcohol use: No   Drug use: No  [2]  Allergies Allergen Reactions   Phentermine Other (See Comments)    Increased depression/anxiety   Poison Oak Extract Rash   Codeine Other (See Comments)    Headaches, itching

## 2024-11-20 NOTE — MAU Note (Signed)
 Amy Rodgers is a 39 y.o. at [redacted]w[redacted]d here in MAU reporting decreased FM since 1500. Baby is usually very active in the evening. Has felt 3 movements since 1500. However, while in Triage she felt the baby move. Baby has cardiac arrythmia and had fetal echo recently. No pain, VB, or LOF.  LMP: na Onset of complaint: 1500 Pain score: 0 Vitals:   11/20/24 2232 11/20/24 2236  BP:  121/76  Pulse: 88   Resp: 18   Temp: (!) 97.5 F (36.4 C)   SpO2: 100%      FHT: 135  Lab orders placed from triage: none

## 2024-11-20 NOTE — MAU Note (Signed)
 Pt given clicker to mark FM. Difficult to monitor FHTs due to irregular FHR

## 2024-11-21 DIAGNOSIS — O368130 Decreased fetal movements, third trimester, not applicable or unspecified: Secondary | ICD-10-CM

## 2024-11-21 DIAGNOSIS — Z3A28 28 weeks gestation of pregnancy: Secondary | ICD-10-CM

## 2024-11-21 NOTE — MAU Note (Signed)
 OK per Camie Butler Potters CNM not to reapply EFM after u/s. Pt aware

## 2024-11-21 NOTE — Progress Notes (Addendum)
 Camie Rote CNM in earlier to discuss u/s results and d/c plan. Printer not working so pt has AVS on MyCH[employer]. Voices understanding with d/c plan. PT did not sign as the esign was not enabled and pt did not have printed papers

## 2024-11-30 ENCOUNTER — Other Ambulatory Visit: Payer: Self-pay | Admitting: Obstetrics & Gynecology

## 2024-11-30 DIAGNOSIS — O368390 Maternal care for abnormalities of the fetal heart rate or rhythm, unspecified trimester, not applicable or unspecified: Secondary | ICD-10-CM

## 2024-12-01 ENCOUNTER — Telehealth: Payer: Self-pay

## 2024-12-07 ENCOUNTER — Other Ambulatory Visit: Payer: Self-pay | Admitting: Obstetrics & Gynecology

## 2024-12-14 DIAGNOSIS — O24414 Gestational diabetes mellitus in pregnancy, insulin controlled: Secondary | ICD-10-CM | POA: Insufficient documentation

## 2024-12-14 DIAGNOSIS — F419 Anxiety disorder, unspecified: Secondary | ICD-10-CM | POA: Insufficient documentation

## 2024-12-14 DIAGNOSIS — O34219 Maternal care for unspecified type scar from previous cesarean delivery: Secondary | ICD-10-CM | POA: Insufficient documentation

## 2024-12-15 ENCOUNTER — Ambulatory Visit: Attending: Obstetrics and Gynecology

## 2024-12-15 ENCOUNTER — Ambulatory Visit (HOSPITAL_BASED_OUTPATIENT_CLINIC_OR_DEPARTMENT_OTHER): Admitting: Obstetrics

## 2024-12-15 ENCOUNTER — Other Ambulatory Visit: Payer: Self-pay | Admitting: Obstetrics & Gynecology

## 2024-12-15 ENCOUNTER — Ambulatory Visit: Admitting: *Deleted

## 2024-12-15 VITALS — BP 121/74 | HR 90

## 2024-12-15 DIAGNOSIS — D649 Anemia, unspecified: Secondary | ICD-10-CM

## 2024-12-15 DIAGNOSIS — Z3A31 31 weeks gestation of pregnancy: Secondary | ICD-10-CM | POA: Diagnosis not present

## 2024-12-15 DIAGNOSIS — O368390 Maternal care for abnormalities of the fetal heart rate or rhythm, unspecified trimester, not applicable or unspecified: Secondary | ICD-10-CM

## 2024-12-15 DIAGNOSIS — O99213 Obesity complicating pregnancy, third trimester: Secondary | ICD-10-CM | POA: Diagnosis not present

## 2024-12-15 DIAGNOSIS — O34219 Maternal care for unspecified type scar from previous cesarean delivery: Secondary | ICD-10-CM | POA: Diagnosis not present

## 2024-12-15 DIAGNOSIS — O09293 Supervision of pregnancy with other poor reproductive or obstetric history, third trimester: Secondary | ICD-10-CM | POA: Insufficient documentation

## 2024-12-15 DIAGNOSIS — O24414 Gestational diabetes mellitus in pregnancy, insulin controlled: Secondary | ICD-10-CM | POA: Diagnosis not present

## 2024-12-15 DIAGNOSIS — Z8632 Personal history of gestational diabetes: Secondary | ICD-10-CM | POA: Diagnosis present

## 2024-12-15 DIAGNOSIS — O368330 Maternal care for abnormalities of the fetal heart rate or rhythm, third trimester, not applicable or unspecified: Secondary | ICD-10-CM

## 2024-12-15 DIAGNOSIS — O99013 Anemia complicating pregnancy, third trimester: Secondary | ICD-10-CM | POA: Diagnosis not present

## 2024-12-15 DIAGNOSIS — O09523 Supervision of elderly multigravida, third trimester: Secondary | ICD-10-CM

## 2024-12-15 DIAGNOSIS — F419 Anxiety disorder, unspecified: Secondary | ICD-10-CM

## 2024-12-15 DIAGNOSIS — E669 Obesity, unspecified: Secondary | ICD-10-CM | POA: Diagnosis not present

## 2024-12-15 NOTE — Progress Notes (Signed)
 MFM Consult Note  Amy Rodgers is currently at [redacted]w[redacted]d. She was seen today due to advanced maternal age (39 years old), maternal obesity with a BMI of 40.3, gestational diabetes treated with both insulin and metformin , and history of chronic hypertension that is not treated with any medications.  She is taking a daily baby aspirin for preeclampsia prophylaxis.  A fetal arrhythmia was noted earlier in her pregnancy.  She was seen by [facility] pediatric cardiology due to the fetal arrhythmia. Her fetal echocardiogram showed premature atrial contractions.  There was normal biventricular size and systolic function noted.  A normal PR interval was also noted.  I reviewed her fingerstick logs.  The vast majority of her postprandial fingerstick values are under the 120s range.  Her fasting fingerstick values have mostly been in the high 90s to low 100s range.  She is being treated with Lantus insulin 16 units in the morning and NovoLog insulin 12 units at night.  She also takes 500 mg of metformin  at bedtime.  She had a cell free DNA test earlier in her pregnancy which indicated a low risk for trisomy 2, 17, and 13. A female fetus is predicted.   Sonographic findings Single intrauterine pregnancy at 31w 4d. Fetal cardiac activity: Arrhythmia noted. Presentation: Cephalic. The views of the fetal anatomy were limited today due to her advanced gestational age and the fetal position. Fetal biometry shows the estimated fetal weight of 4 lb 10 oz,  2110g (85%). Amniotic fluid: Within normal limits. AFI: 13.6 cm.  MVP: 4.89 cm. Placenta: Anterior. BPP: 10/10 with a reactive NST.   The vast majority of the fetal heart rate was regular rate and rhythm.  An occasional skipped beat was noted.    The patient was advised that the occasional skipped beat is most likely due to premature atrial contractions.  There were no signs of fetal hydrops noted today.    The patient was informed that anomalies may be missed due  to technical limitations. If the fetus is in a suboptimal position or maternal habitus is increased, visualization of the fetus in the maternal uterus may be impaired.  Gestational Diabetes  She was advised to continue to monitor her fingersticks 4 times daily (fasting and 2 hours after each meal).    She was advised that our goals for her fingerstick values are fasting values of 90-95 or less and two-hour postprandial values of 120 or less.    Should her fasting fingerstick levels continue to be above the 95 range, she was advised to increase her long-acting insulin (Lantus/glargine) to 16 to 18 units as needed.  Fetal arrhythmia most likely due to PACs  On today's exam, the majority of the fetal heart rate was regular.    An occasional dropped beat was noted.    The patient was advised that the occasional dropped beat is most likely due to premature atrial contractions (PACs).    She was advised that PACs are usually benign and may be present on one visit and may not be present on the next visit.    PACs generally will resolve after birth.    As there were no signs of hydrops noted today, she was reassured that her baby most likely has not been affected by the arrhythmia noted today.    Although rare, PACs occasionally may progress to supraventricular tachycardia with a fetal heart rate greater than 200.   Due to the fetal arrhythmia noted today, she should continue weekly nonstress tests  in your office until delivery.  She was reassured that the fetal testing today was reassuring.  Timing of delivery  Due to gestational diabetes and the fetal arrhythmia, delivery may be considered at around 38 weeks.    However, delivery at 37 weeks may be considered should her fasting fingerstick values remain elevated despite medication treatment.  The patient has stated that she will be delivered via repeat C-section.  A follow-up growth scan and BPP was scheduled in our office in 4 weeks.   She should continue weekly fetal testing in your office until delivery.  The patient stated that all of her questions were answered.   A total of 45 minutes was spent counseling, documenting, and coordinating the care for this patient.  Greater than 50% of the time was spent in direct face-to-face contact.

## 2024-12-15 NOTE — Procedures (Signed)
 Amy Rodgers [birth year] [redacted]w[redacted]d  Fetus A Non-Stress Test Interpretation for 12/15/24  Indication: fetal arrhythmia   Fetal Heart Rate A Mode: External Baseline Rate (A): 140 bpm Variability: Moderate Accelerations: 10 x 10 Decelerations: None Multiple birth?: No  Uterine Activity Mode: Toco Contraction Frequency (min): none Resting Tone Palpated: Relaxed  Interpretation (Fetal Testing) Nonstress Test Interpretation: Reactive Comments: Tracing reviewed by Dr. Arna

## 2025-01-11 ENCOUNTER — Ambulatory Visit

## 2025-01-26 ENCOUNTER — Encounter (HOSPITAL_COMMUNITY): Payer: Self-pay

## 2025-01-26 ENCOUNTER — Inpatient Hospital Stay (HOSPITAL_COMMUNITY): Admit: 2025-01-26 | Admitting: Obstetrics & Gynecology
